# Patient Record
Sex: Male | Born: 1955 | Race: White | Marital: Married | Smoking: Never smoker
Health system: Southern US, Community
[De-identification: ages and names within clinical notes are randomized; demographics above are authoritative.]

## PROBLEM LIST (undated history)

## (undated) DIAGNOSIS — E785 Hyperlipidemia, unspecified: Secondary | ICD-10-CM

## (undated) DIAGNOSIS — K5792 Diverticulitis of intestine, part unspecified, without perforation or abscess without bleeding: Secondary | ICD-10-CM

## (undated) DIAGNOSIS — I10 Essential (primary) hypertension: Secondary | ICD-10-CM

## (undated) DIAGNOSIS — I213 ST elevation (STEMI) myocardial infarction of unspecified site: Secondary | ICD-10-CM

## (undated) HISTORY — DX: ST elevation (STEMI) myocardial infarction of unspecified site: I21.3

## (undated) HISTORY — DX: Hyperlipidemia, unspecified: E78.5

## (undated) HISTORY — PX: APPENDECTOMY: SHX54

---

## 1999-08-08 ENCOUNTER — Encounter: Payer: Self-pay | Admitting: Vascular Surgery

## 1999-08-10 ENCOUNTER — Ambulatory Visit (HOSPITAL_COMMUNITY): Admission: RE | Admit: 1999-08-10 | Discharge: 1999-08-10 | Payer: Self-pay | Admitting: Vascular Surgery

## 2008-05-31 ENCOUNTER — Ambulatory Visit: Payer: Self-pay | Admitting: General Practice

## 2010-03-10 ENCOUNTER — Ambulatory Visit: Payer: Self-pay | Admitting: Gastroenterology

## 2010-07-13 ENCOUNTER — Ambulatory Visit: Payer: Self-pay | Admitting: Internal Medicine

## 2010-08-03 ENCOUNTER — Ambulatory Visit: Payer: Self-pay | Admitting: Internal Medicine

## 2010-09-14 ENCOUNTER — Ambulatory Visit: Payer: Self-pay | Admitting: Internal Medicine

## 2010-10-04 ENCOUNTER — Ambulatory Visit: Payer: Self-pay | Admitting: Internal Medicine

## 2015-03-23 HISTORY — PX: OTHER SURGICAL HISTORY: SHX169

## 2015-08-17 ENCOUNTER — Encounter (HOSPITAL_COMMUNITY)
Admission: EM | Disposition: A | Discharge status: Home / Self Care | Payer: Self-pay | Source: Home / Self Care | Attending: Cardiovascular Disease

## 2015-08-17 ENCOUNTER — Emergency Department: Payer: BLUE CROSS/BLUE SHIELD

## 2015-08-17 ENCOUNTER — Inpatient Hospital Stay (HOSPITAL_COMMUNITY)
Admission: EM | Admit: 2015-08-17 | Discharge: 2015-08-19 | DRG: 247 | Disposition: A | Discharge status: Home / Self Care | Payer: BLUE CROSS/BLUE SHIELD | Attending: Cardiovascular Disease | Admitting: Cardiovascular Disease

## 2015-08-17 ENCOUNTER — Emergency Department
Admission: EM | Admit: 2015-08-17 | Discharge: 2015-08-17 | Disposition: A | Discharge status: Other Acute Inpatient Hospital | Payer: BLUE CROSS/BLUE SHIELD | Attending: Student | Admitting: Student

## 2015-08-17 ENCOUNTER — Encounter: Payer: Self-pay | Admitting: Emergency Medicine

## 2015-08-17 ENCOUNTER — Encounter (HOSPITAL_COMMUNITY): Payer: Self-pay | Admitting: *Deleted

## 2015-08-17 DIAGNOSIS — I213 ST elevation (STEMI) myocardial infarction of unspecified site: Secondary | ICD-10-CM

## 2015-08-17 DIAGNOSIS — R079 Chest pain, unspecified: Secondary | ICD-10-CM

## 2015-08-17 DIAGNOSIS — I2510 Atherosclerotic heart disease of native coronary artery without angina pectoris: Secondary | ICD-10-CM

## 2015-08-17 DIAGNOSIS — Z881 Allergy status to other antibiotic agents status: Secondary | ICD-10-CM

## 2015-08-17 DIAGNOSIS — K5792 Diverticulitis of intestine, part unspecified, without perforation or abscess without bleeding: Secondary | ICD-10-CM | POA: Diagnosis present

## 2015-08-17 DIAGNOSIS — Z888 Allergy status to other drugs, medicaments and biological substances status: Secondary | ICD-10-CM | POA: Diagnosis not present

## 2015-08-17 DIAGNOSIS — I10 Essential (primary) hypertension: Secondary | ICD-10-CM | POA: Insufficient documentation

## 2015-08-17 DIAGNOSIS — I2102 ST elevation (STEMI) myocardial infarction involving left anterior descending coronary artery: Principal | ICD-10-CM

## 2015-08-17 DIAGNOSIS — R739 Hyperglycemia, unspecified: Secondary | ICD-10-CM | POA: Diagnosis not present

## 2015-08-17 DIAGNOSIS — I959 Hypotension, unspecified: Secondary | ICD-10-CM | POA: Diagnosis not present

## 2015-08-17 DIAGNOSIS — Z955 Presence of coronary angioplasty implant and graft: Secondary | ICD-10-CM

## 2015-08-17 DIAGNOSIS — R05 Cough: Secondary | ICD-10-CM | POA: Diagnosis not present

## 2015-08-17 DIAGNOSIS — I252 Old myocardial infarction: Secondary | ICD-10-CM | POA: Insufficient documentation

## 2015-08-17 DIAGNOSIS — E785 Hyperlipidemia, unspecified: Secondary | ICD-10-CM | POA: Diagnosis not present

## 2015-08-17 DIAGNOSIS — Z8249 Family history of ischemic heart disease and other diseases of the circulatory system: Secondary | ICD-10-CM | POA: Diagnosis not present

## 2015-08-17 DIAGNOSIS — T464X5A Adverse effect of angiotensin-converting-enzyme inhibitors, initial encounter: Secondary | ICD-10-CM | POA: Diagnosis not present

## 2015-08-17 HISTORY — DX: Essential (primary) hypertension: I10

## 2015-08-17 HISTORY — PX: CARDIAC CATHETERIZATION: SHX172

## 2015-08-17 HISTORY — DX: ST elevation (STEMI) myocardial infarction of unspecified site: I21.3

## 2015-08-17 HISTORY — DX: Diverticulitis of intestine, part unspecified, without perforation or abscess without bleeding: K57.92

## 2015-08-17 LAB — CBC WITH DIFFERENTIAL/PLATELET
Basophils Absolute: 0.1 10*3/uL (ref 0–0.1)
Basophils Relative: 1 %
Eosinophils Absolute: 0.1 10*3/uL (ref 0–0.7)
Eosinophils Relative: 1 %
HCT: 40.2 % (ref 40.0–52.0)
Hemoglobin: 13.8 g/dL (ref 13.0–18.0)
Lymphocytes Relative: 30 %
Lymphs Abs: 2.4 10*3/uL (ref 1.0–3.6)
MCH: 29.7 pg (ref 26.0–34.0)
MCHC: 34.3 g/dL (ref 32.0–36.0)
MCV: 86.5 fL (ref 80.0–100.0)
Monocytes Absolute: 0.9 10*3/uL (ref 0.2–1.0)
Monocytes Relative: 11 %
Neutro Abs: 4.6 10*3/uL (ref 1.4–6.5)
Neutrophils Relative %: 57 %
Platelets: 185 10*3/uL (ref 150–440)
RBC: 4.64 MIL/uL (ref 4.40–5.90)
RDW: 12.6 % (ref 11.5–14.5)
WBC: 8 10*3/uL (ref 3.8–10.6)

## 2015-08-17 LAB — PROTIME-INR
INR: 1.06
PROTHROMBIN TIME: 14 s (ref 11.4–15.0)

## 2015-08-17 LAB — BASIC METABOLIC PANEL
ANION GAP: 9 (ref 5–15)
BUN: 18 mg/dL (ref 6–20)
CALCIUM: 9.1 mg/dL (ref 8.9–10.3)
CO2: 25 mmol/L (ref 22–32)
Chloride: 103 mmol/L (ref 101–111)
Creatinine, Ser: 1.21 mg/dL (ref 0.61–1.24)
Glucose, Bld: 138 mg/dL — ABNORMAL HIGH (ref 65–99)
Potassium: 3.8 mmol/L (ref 3.5–5.1)
SODIUM: 137 mmol/L (ref 135–145)

## 2015-08-17 LAB — TROPONIN I
Troponin I: <0.03 ng/mL (ref <0.031)
Troponin I: 0.64 ng/mL (ref <0.031)

## 2015-08-17 LAB — MRSA PCR SCREENING: MRSA by PCR: NEGATIVE

## 2015-08-17 LAB — POCT ACTIVATED CLOTTING TIME: Activated Clotting Time: 446 seconds

## 2015-08-17 LAB — APTT: aPTT: 25 seconds (ref 24–36)

## 2015-08-17 SURGERY — LEFT HEART CATH AND CORONARY ANGIOGRAPHY
Laterality: Right

## 2015-08-17 SURGERY — LEFT HEART CATH AND CORONARY ANGIOGRAPHY
Anesthesia: LOCAL

## 2015-08-17 MED ORDER — SODIUM CHLORIDE 0.9 % IV BOLUS (SEPSIS)
1000.0000 mL | Freq: Once | INTRAVENOUS | Status: AC
  Administered 2015-08-17: 1000 mL via INTRAVENOUS

## 2015-08-17 MED ORDER — HEPARIN SODIUM (PORCINE) 5000 UNIT/ML IJ SOLN
4000.0000 [IU] | Freq: Once | INTRAMUSCULAR | Status: AC
  Administered 2015-08-17: 4000 [IU] via INTRAVENOUS
  Filled 2015-08-17: qty 1

## 2015-08-17 MED ORDER — FENTANYL CITRATE (PF) 100 MCG/2ML IJ SOLN
INTRAMUSCULAR | Status: DC | PRN
  Administered 2015-08-17 (×3): 25 ug via INTRAVENOUS

## 2015-08-17 MED ORDER — SODIUM CHLORIDE 0.9 % IV SOLN
INTRAVENOUS | Status: DC | PRN
  Administered 2015-08-17: 150 mL/h via INTRAVENOUS

## 2015-08-17 MED ORDER — IOHEXOL 350 MG/ML SOLN
INTRAVENOUS | Status: DC | PRN
  Administered 2015-08-17: 225 mL via INTRACARDIAC

## 2015-08-17 MED ORDER — ONDANSETRON HCL 4 MG/2ML IJ SOLN
INTRAMUSCULAR | Status: AC
  Filled 2015-08-17: qty 2

## 2015-08-17 MED ORDER — ONDANSETRON HCL 4 MG/2ML IJ SOLN
4.0000 mg | Freq: Once | INTRAMUSCULAR | Status: AC
  Administered 2015-08-17: 4 mg via INTRAVENOUS

## 2015-08-17 MED ORDER — HEPARIN (PORCINE) IN NACL 100-0.45 UNIT/ML-% IJ SOLN
1100.0000 [IU]/h | INTRAMUSCULAR | Status: DC
  Filled 2015-08-17: qty 250

## 2015-08-17 MED ORDER — NITROGLYCERIN 0.4 MG SL SUBL: 0.4000 mg | SUBLINGUAL_TABLET | Freq: Once | SUBLINGUAL | Status: DC

## 2015-08-17 MED ORDER — SODIUM CHLORIDE 0.9 % IV SOLN
250.0000 mg | INTRAVENOUS | Status: DC | PRN
  Administered 2015-08-17: 1.75 mg/kg/h via INTRAVENOUS

## 2015-08-17 MED ORDER — MIDAZOLAM HCL 2 MG/2ML IJ SOLN
INTRAMUSCULAR | Status: AC
  Filled 2015-08-17: qty 2

## 2015-08-17 MED ORDER — LIDOCAINE HCL (PF) 1 % IJ SOLN
INTRAMUSCULAR | Status: DC | PRN
  Administered 2015-08-17: 20 mL via INTRADERMAL

## 2015-08-17 MED ORDER — MORPHINE SULFATE (PF) 4 MG/ML IV SOLN
INTRAVENOUS | Status: AC
  Filled 2015-08-17: qty 1

## 2015-08-17 MED ORDER — CARVEDILOL 3.125 MG PO TABS
3.1250 mg | ORAL_TABLET | Freq: Two times a day (BID) | ORAL | Status: DC
  Administered 2015-08-17 – 2015-08-19 (×4): 3.125 mg via ORAL
  Filled 2015-08-17 (×4): qty 1

## 2015-08-17 MED ORDER — FENTANYL CITRATE (PF) 100 MCG/2ML IJ SOLN
50.0000 ug | Freq: Once | INTRAMUSCULAR | Status: AC
  Administered 2015-08-17: 50 ug via INTRAVENOUS

## 2015-08-17 MED ORDER — SODIUM CHLORIDE 0.9 % IV SOLN
INTRAVENOUS | Status: AC
  Administered 2015-08-17: 150 mL/h via INTRAVENOUS
  Administered 2015-08-17: 19:00:00 via INTRAVENOUS

## 2015-08-17 MED ORDER — TICAGRELOR 90 MG PO TABS
90.0000 mg | ORAL_TABLET | Freq: Two times a day (BID) | ORAL | Status: DC
  Administered 2015-08-17 – 2015-08-19 (×4): 90 mg via ORAL
  Filled 2015-08-17 (×5): qty 1

## 2015-08-17 MED ORDER — SODIUM CHLORIDE 0.9 % IV SOLN: 250.0000 mL | INTRAVENOUS | Status: DC | PRN

## 2015-08-17 MED ORDER — NITROGLYCERIN 1 MG/10 ML FOR IR/CATH LAB
INTRA_ARTERIAL | Status: DC | PRN
  Administered 2015-08-17 (×2): 200 ug via INTRACORONARY

## 2015-08-17 MED ORDER — FENTANYL CITRATE (PF) 100 MCG/2ML IJ SOLN
INTRAMUSCULAR | Status: AC
  Filled 2015-08-17: qty 2

## 2015-08-17 MED ORDER — SODIUM CHLORIDE 0.9 % IJ SOLN
3.0000 mL | Freq: Two times a day (BID) | INTRAMUSCULAR | Status: DC
  Administered 2015-08-17 – 2015-08-19 (×4): 3 mL via INTRAVENOUS

## 2015-08-17 MED ORDER — BIVALIRUDIN 250 MG IV SOLR
INTRAVENOUS | Status: AC
  Filled 2015-08-17: qty 250

## 2015-08-17 MED ORDER — MIDAZOLAM HCL 2 MG/2ML IJ SOLN
INTRAMUSCULAR | Status: DC | PRN
  Administered 2015-08-17 (×2): 1 mg via INTRAVENOUS
  Administered 2015-08-17: 2 mg via INTRAVENOUS

## 2015-08-17 MED ORDER — SODIUM CHLORIDE 0.9 % IV SOLN
INTRAVENOUS | Status: DC
  Administered 2015-08-17: 20 mL/h via INTRAVENOUS

## 2015-08-17 MED ORDER — CLOPIDOGREL BISULFATE 75 MG PO TABS
600.0000 mg | ORAL_TABLET | Freq: Once | ORAL | Status: AC
  Administered 2015-08-17: 600 mg via ORAL
  Filled 2015-08-17: qty 8

## 2015-08-17 MED ORDER — ONDANSETRON HCL 4 MG/2ML IJ SOLN: 4.0000 mg | Freq: Four times a day (QID) | INTRAMUSCULAR | Status: DC | PRN

## 2015-08-17 MED ORDER — NITROGLYCERIN 1 MG/10 ML FOR IR/CATH LAB
INTRA_ARTERIAL | Status: AC
  Filled 2015-08-17: qty 10

## 2015-08-17 MED ORDER — MORPHINE SULFATE (PF) 4 MG/ML IV SOLN
4.0000 mg | Freq: Once | INTRAVENOUS | Status: AC
  Administered 2015-08-17: 4 mg via INTRAVENOUS

## 2015-08-17 MED ORDER — LIDOCAINE HCL (PF) 1 % IJ SOLN
INTRAMUSCULAR | Status: AC
  Filled 2015-08-17: qty 30

## 2015-08-17 MED ORDER — HEPARIN (PORCINE) IN NACL 2-0.9 UNIT/ML-% IJ SOLN
INTRAMUSCULAR | Status: AC
  Filled 2015-08-17: qty 1000

## 2015-08-17 MED ORDER — SODIUM CHLORIDE 0.9 % IJ SOLN: 3.0000 mL | INTRAMUSCULAR | Status: DC | PRN

## 2015-08-17 MED ORDER — ATROPINE SULFATE 0.1 MG/ML IJ SOLN
INTRAMUSCULAR | Status: AC
  Filled 2015-08-17: qty 10

## 2015-08-17 MED ORDER — BIVALIRUDIN BOLUS VIA INFUSION - CUPID
INTRAVENOUS | Status: DC | PRN
  Administered 2015-08-17: 65.325 mg via INTRAVENOUS

## 2015-08-17 MED ORDER — CETYLPYRIDINIUM CHLORIDE 0.05 % MT LIQD: 7.0000 mL | Freq: Two times a day (BID) | OROMUCOSAL | Status: DC

## 2015-08-17 MED ORDER — ACETAMINOPHEN 325 MG PO TABS: 650.0000 mg | ORAL_TABLET | ORAL | Status: DC | PRN

## 2015-08-17 MED ORDER — ASPIRIN EC 81 MG PO TBEC
81.0000 mg | DELAYED_RELEASE_TABLET | Freq: Every day | ORAL | Status: DC
  Administered 2015-08-18 – 2015-08-19 (×2): 81 mg via ORAL
  Filled 2015-08-17 (×3): qty 1

## 2015-08-17 SURGICAL SUPPLY — 18 items
BALLN EUPHORA RX 2.0X12 (BALLOONS) ×4
BALLN ~~LOC~~ EUPHORA RX 3.75X15 (BALLOONS) ×4
BALLOON EUPHORA RX 2.0X12 (BALLOONS) ×2 IMPLANT
BALLOON ~~LOC~~ EUPHORA RX 3.75X15 (BALLOONS) ×2 IMPLANT
CATH INFINITI 5FR MULTPACK ANG (CATHETERS) ×4 IMPLANT
CATH VISTA GUIDE 6FR XB4 (CATHETERS) IMPLANT
CATH VISTA GUIDE 6FR XBLAD4 (CATHETERS) ×4 IMPLANT
KIT ENCORE 26 ADVANTAGE (KITS) ×4 IMPLANT
KIT HEART LEFT (KITS) ×4 IMPLANT
PACK CARDIAC CATHETERIZATION (CUSTOM PROCEDURE TRAY) ×4 IMPLANT
SHEATH PINNACLE 6F 10CM (SHEATH) ×4 IMPLANT
STENT RESOLUTE INTEG 3.5X22 (Permanent Stent) ×4 IMPLANT
SYR MEDRAD MARK V 150ML (SYRINGE) ×4 IMPLANT
TRANSDUCER W/STOPCOCK (MISCELLANEOUS) ×4 IMPLANT
TUBING CIL FLEX 10 FLL-RA (TUBING) ×4 IMPLANT
WIRE ASAHI PROWATER 180CM (WIRE) ×4 IMPLANT
WIRE EMERALD 3MM-J .035X150CM (WIRE) ×4 IMPLANT
WIRE SAFE-T 1.5MM-J .035X260CM (WIRE) ×4 IMPLANT

## 2015-08-17 NOTE — Progress Notes (Signed)
CRITICAL VALUE ALERT  Critical value received: Troponin =0.64   Date of notification: 08/17/15  Time of notification:  1630  Critical value read back:Yes  Nurse who received alert:  O. Delford FieldAnde, RN  MD notified (1st page):  No. Post cath troponin, expected to be high  Time of first page:   MD notified (2nd page):  Time of second page:  Responding MD:    Time MD responded:

## 2015-08-17 NOTE — H&P (Signed)
Patient ID: George Jimenez MRN: [id number], DOB/AGE: 1956-07-28   Admit date: 08/17/2015   Primary Physician: Danella Penton., George Jimenez Primary Cardiologist: New- Dr. Tresa Endo  Pt. Profile:  George Jimenez is a 59 y.o. male with a history of HTN and diverticulitis who was transferred to [facility] from [facility] for with an acute STEMI.    He presented to [facility] emergency department today for evaluation of sudden onset central nonradiating chest pain/pressure today which began while he was doing back exercises at the gym. Pain was constant since onset, associated with diaphoresis and shortness of breath. He also had nausea which proceeded a fainting episode at the gym per notes from ED provider at [facility]. However, patient denies passing out.   He works out everyday and prior to this week has never had any exertional symptoms. This past week he has had on and off chest pain associated with exertion but nothing as severe as today. First troponin negative and all other labwork unremarkable, except some mild hyperglycemia.   His first ECG showed no acute ST elevation. With 1 mm of ST depression in V3, V4. A subsequent ECG showed  ST elevation in lead I, aVL, V2. ST depression in 2, 3, aVF as well as hyperacute T waves in V3-V6 and he was transferred to [facility] for emergent cardiac catheterization. He was given 1 SL NTG with no relief. He was given morphine and fentanyl which caused marked hypotension. He is now chest pain free.   He is a never smoker. He only take Losartan for HTN and was recently placed on an antibiotic for diverticulitis but he cannot recall the name. He denies use of pre workout supplements. No hx of CVA or major bleed. His father had CABG in his 28s. No other heart disease in his family.    Problem List  Past Medical History  Diagnosis Date  . HTN (hypertension)   . Diverticulitis     No past surgical history on file.   Allergies  Allergies  Allergen Reactions  . Levaquin [Levofloxacin In  D5w]      Home Medications  Prior to Admission medications   Not on File    Family History  Family History  Problem Relation Age of Onset  . Heart attack Father     CABG in 16s   No family status information on file.     Social History  Social History   Social History  . Marital Status: Married    Spouse Name: N/A  . Number of Children: N/A  . Years of Education: N/A   Occupational History  . Not on file.   Social History Main Topics  . Smoking status: Never Smoker   . Smokeless tobacco: Not on file  . Alcohol Use: No  . Drug Use: Not on file  . Sexual Activity: Not on file   Other Topics Concern  . Not on file   Social History Narrative  . No narrative on file      All other systems reviewed and are otherwise negative except as noted above.  Physical Exam  Blood pressure 115/73, pulse 0, resp. rate 0, SpO2 0 %.  General: Pleasant, NAD. Fit appearing white male. Tremulous Psych: Normal affect. Neuro: Alert and oriented X 3. Moves all extremities spontaneously. HEENT: Normal  Neck: Supple without bruits or JVD. Lungs:  Resp regular and unlabored, CTA. Heart: RRR no s3, s4, or murmurs. Abdomen: Soft, non-tender, non-distended, BS + x 4.  Extremities: No clubbing,  cyanosis or edema. DP/PT/Radials 2+ and equal bilaterally.  Labs   Recent Labs  08/17/15 1312  TROPONINI <0.03   Lab Results  Component Value Date   WBC 8.0 08/17/2015   HGB 13.8 08/17/2015   HCT 40.2 08/17/2015   MCV 86.5 08/17/2015   PLT 185 08/17/2015    Recent Labs Lab 08/17/15 1312  NA 137  K 3.8  CL 103  CO2 25  BUN 18  CREATININE 1.21  CALCIUM 9.1  GLUCOSE 138*   No results found for: CHOL, HDL, LDLCALC, TRIG No results found for: DDIMER   Radiology/Studies  Dg Chest Portable 1 View  08/17/2015  CLINICAL DATA:  Syncopal episode while working out. Chest pain for 1 week. EXAM: PORTABLE CHEST 1 VIEW COMPARISON:  None. FINDINGS: Normal cardiac silhouette and  mediastinal contours given decreased lung volumes and AP projection. Minimal bilateral infrahilar opacities favored to represent atelectasis. No pleural effusion or pneumothorax. No evidence of edema. No acute osseus abnormalities. IMPRESSION: No definite acute cardiopulmonary disease on this AP portable examination. Further evaluation with a PA and lateral chest radiograph may be obtained as clinically indicated. Electronically Signed   By: Simonne ComeJohn  Watts M.D.   On: 08/17/2015 13:54    ECG: 08/17/2015  Rate: 49  Rhythm: sinus bradycardia  Axis: normal  Intervals:none  ST&T Change: ST elevation in lead 1, aVL, V2. ST depression in 2, 3, aVF. Hyperacute T waves in V3-V6.   ASSESSMENT AND PLAN  George Jimenez is a 59 y.o. male with a history of HTN and diverticulitis who was [facility] for with an acute STEMI.    PLAN: emergent cardiac catheterization with possible PCI.    Billy FischerSigned, THOMPSON, KATHRYN R, PA-C 08/17/2015, 4:02 PM  Pager [phone]   Patient seen and examined. Agree with assessment and plan.  George Jimenez is a 59 year old male without any known history of prior coronary artery disease.  He has a history of hypertension which has been treated with losartan.  He recently was placed on an antibiotic for diverticulitis.  The patient exercises regularly.  He denies any use of supplements.  Over the past week, he has noticed some mild chest sensation.  Today while he was doing back exercises he developed  substernal chest pain which was constant, associated with diaphoresis and shortness of breath.  He presented to [facility].  Reportedly an initial ECG was without acute ST segment changes, but a subsequent ECG showed ST elevation in leads 1, aVL, significant ST depression inferiorly, and hyperacute T waves V3 through V6.  A code STEMI was activated and he was transferred emergently to  [facility] for emergent cardiac catheterization and probable  PCI if indicated.  Note, prior to transfer [facility] he received Plavix 600 mg orally and heparin.  George Biharihomas A. Rannie Craney, George Jimenez, [facility] 08/17/2015 4:12 PM

## 2015-08-17 NOTE — Progress Notes (Signed)
(  Consult ordered when [facility])  ANTICOAGULATION CONSULT NOTE - Initial Consult  Pharmacy Consult for Heparin Drip Indication: chest pain/ACS  Allergies  Allergen Reactions  . Ace Inhibitors Cough  . Levaquin [Levofloxacin In D5w]     Patient Measurements: Height: 6' (182.9 cm) Weight: 192 lb (87.091 kg) IBW/kg (Calculated) : 77.6 Heparin Dosing Weight: 87 kg  Vital Signs: Temp: 97.5 F (36.4 C) (12/14 1400) BP: 115/73 mmHg (12/14 1534) Pulse Rate: 0 (12/14 1554)  Labs:  Recent Labs  08/17/15 1312  HGB 13.8  HCT 40.2  PLT 185  APTT 25  LABPROT 14.0  INR 1.06  CREATININE 1.21  TROPONINI <0.03    Estimated Creatinine Clearance: 73 mL/min (by C-G formula based on Cr of 1.21).   Medical History: Past Medical History  Diagnosis Date  . HTN (hypertension)   . Diverticulitis     Medications:  Scheduled:  Infusions:   Assessment: 59 yo male to start Heparin drip for ACS/STEMI. No anticoagulants noted in PTA medications at this time.  Goal of Therapy:  Heparin level 0.3-0.7 units/ml Monitor platelets per protocol.   Plan:  Give 4000 units bolus x 1  Start Heparin drip at 1100 units/hour. Order Heparin level in 6 hours.  Addendum:   Patient [facility].   Bari MantisKristin Tirsa Gail PharmD Clinical Pharmacist 08/17/2015

## 2015-08-17 NOTE — ED Provider Notes (Signed)
[facility] Emergency Department Provider Note  ____________________________________________  Time seen: Approximately 1:15 PM  I have reviewed the triage vital signs and the nursing notes.   HISTORY  Chief Complaint Chest Pain    HPI Eean Buss is a 59 y.o. male hypertension and diverticulitis who presents for evaluation of sudden onset central nonradiating chest pain/pressure today which began while he was doing back exercises at the gym. Pain has been constant since onset, associated with diaphoresis and shortness of breath. He also had nausea which proceeded a fainting episode at the gym. On EMS arrival, he was alert but hypotensive. The ports pain does not radiate to his back, into his neck or shoulders or down towards his feet. He has been having some mild chest pain intermittently over the past several days but none so severe as this.  History reviewed. No pertinent past medical history.  There are no active problems to display for this patient.   History reviewed. No pertinent past surgical history.  No current outpatient prescriptions on file.  Allergies Review of patient's allergies indicates no known allergies.  History reviewed. No pertinent family history.  Social History Social History  Substance Use Topics  . Smoking status: Never Smoker   . Smokeless tobacco: None  . Alcohol Use: No    Review of Systems Constitutional: No fever/chills Eyes: No visual changes. ENT: No sore throat. Cardiovascular: + chest pain. Respiratory: Denies shortness of breath. Gastrointestinal: No abdominal pain.  + nausea, no vomiting.  No diarrhea.  No constipation. Genitourinary: Negative for dysuria. Musculoskeletal: Negative for back pain. Skin: Negative for rash. Neurological: Negative for headaches, focal weakness or numbness.  10-point ROS otherwise negative.  ____________________________________________   PHYSICAL EXAM:   Filed Vitals:    08/17/15 1316 08/17/15 1358  BP: 118/85 119/84  Pulse: 61 58  Temp: 97.7 F (36.5 C) 97.5 F (36.4 C)  Resp: 10 16  Height: 6' (1.829 m)   Weight: 192 lb (87.091 kg)      Constitutional: Alert and oriented. In distress due to pain. Eyes: Conjunctivae are normal. PERRL. EOMI. Head: Atraumatic. Nose: No congestion/rhinnorhea. Mouth/Throat: Mucous membranes are moist.  Oropharynx non-erythematous. Neck: No stridor. Cardiovascular: Normal rate, regular rhythm. Grossly normal heart sounds.  Good peripheral circulation. Respiratory: Normal respiratory effort.  No retractions. Lungs CTAB. Gastrointestinal: Soft and nontender. No distention.  No CVA tenderness. Genitourinary: deferred Musculoskeletal: No lower extremity tenderness nor edema.  No joint effusions. Neurologic:  Normal speech and language. No gross focal neurologic deficits are appreciated. No gait instability. Skin:  Skin is warm, dry and intact. No rash noted. Psychiatric: Mood and affect are normal. Speech and behavior are normal.  ____________________________________________   LABS (all labs ordered are listed, but only abnormal results are displayed)  Labs Reviewed  BASIC METABOLIC PANEL - Abnormal; Notable for the following:    Glucose, Bld 138 (*)    All other components within normal limits  CBC WITH DIFFERENTIAL/PLATELET  TROPONIN I  PROTIME-INR  APTT   ____________________________________________  EKG  ED ECG REPORT I, Gayla Doss, the attending physician, personally viewed and interpreted this ECG.   Date: 08/17/2015  EKG Time: 13:06  Rate: 67  Rhythm: normal sinus rhythm  Axis: normal  Intervals:none  ST&T Change: No acute ST elevation. With 1 mm of ST depression in V3, V4.  ED ECG REPORT I, Gayla Doss, the attending physician, personally viewed and interpreted this ECG.   Date: 08/17/2015  EKG Time: 13:32  Rate: 49  Rhythm: sinus bradycardia  Axis: normal  Intervals:none   ST&T Change: ST elevation in lead 1, aVL, V2. ST depression in 2, 3, aVF. Hyperacute T waves in V4, V5, V6.   ____________________________________________  RADIOLOGY  CXR IMPRESSION: No definite acute cardiopulmonary disease on this AP portable examination. Further evaluation with a PA and lateral chest radiograph may be obtained as clinically indicated.   ____________________________________________   PROCEDURES  Procedure(s) performed: None  Critical Care performed: Yes, see critical care note(s). Total critical care time spent 35 minutes.  ____________________________________________   INITIAL IMPRESSION / ASSESSMENT AND PLAN / ED COURSE  Pertinent labs & imaging results that were available during my care of the patient were reviewed by me and considered in my medical decision making (see chart for details).  Lita MainsRussell Hark is a 59 y.o. male hypertension and diverticulitis who presents for evaluation of sudden onset central nonradiating chest pain/pressure today which began while he was doing back exercises at the gym. He also had an episode of syncope today that followed the onset of chest pain. On arrival to the emergency department, he is in distress due to pain. He is afebrile, no tachycardia. BP transiently dropped to 80s/50s, improved with IV fluids. EKG not consistent with STEMI. We'll obtain screening cardiac labs, chest x-ray, treat his pain. No nitroglycerin at this time secondary to intermittent drops in blood pressure.  ----------------------------------------- 1:44 PM on 08/17/2015 -----------------------------------------  Repeat EKG obtained given persistent pain. 1 mm ST elevation noted in lead 1, aVL, hyperacute T waves in V4, V5, V6 with reciprocal ST depression in inferior leads. Case [facility] the patient as transfer given concern for STEMI. There is [facility].  Heparin, Plavix ordered. Patient received aspirin from EMS and routes. Due to intermittent hypotension, no nitroglycerin but pain improved with fentanyl and morphine at this time (currently rates pain as 2/10). ____________________________________________   FINAL CLINICAL IMPRESSION(S) / ED DIAGNOSES  Final diagnoses:  ST elevation myocardial infarction (STEMI), unspecified artery (HCC)  Chest pain, unspecified chest pain type      Gayla DossEryka A Dagny Fiorentino, MD 08/17/15 1400

## 2015-08-17 NOTE — Progress Notes (Signed)
Right Femoral Sheath, site level 0 and distal pulses present Patient educated prior to sheath removal and patient verbalized understanding in his own words. Pressure held for 20 minutes starting at 2028 and ending at 2048. Patient vital signs stable and patient had no complaints during sheath removal. Post sheath removal site level 0 and distal pulses equal to baseline. Post sheath removal instructions given to patient.  Patient verbalized and demonstrated understanding. Pressure dressing applied.  Ivery QualeJackson, Miquel Stacks A, RN

## 2015-08-17 NOTE — H&P (Deleted)
Patient ID: Jasraj Lappe MRN: [id number], DOB/AGE: July 14, 1956   Admit date: 08/17/2015   Primary Physician: Danella Penton., MD Primary Cardiologist: New- Dr. Tresa Endo Pt. Profile:  Gaege Sangalang is a 59 y.o. male with a history of HTN and diverticulitis who was transferred to [facility] from [facility] for with an acute STEMI.    He presented to [facility] emergency department today for evaluation of sudden onset central nonradiating chest pain/pressure today which began while he was doing back exercises at the gym. Pain was constant since onset, associated with diaphoresis and shortness of breath. He also had nausea which proceeded a fainting episode at the gym per notes from ED provider at [facility]. However, patient denies passing out.   He works out everyday and prior to this week has never had any exertional symptoms. This past week he has had on and off chest pain associated with exertion but nothing as severe as today. First troponin negative and all other labwork unremarkable, except some mild hyperglycemia.   His first ECG showed no acute ST elevation. With 1 mm of ST depression in V3, V4. A subsequent ECG showed  ST elevation in lead I, aVL, V2. ST depression in 2, 3, aVF as well as hyperacute T waves in V3-V6 and he was transferred to [facility] for emergent cardiac catheterization. He was given 1 SL NTG with no relief. He was given morphine and fentanyl which caused marked hypotension. He is now chest pain free.   He is a never smoker. He only take Losartan for HTN and was recently placed on an antibiotic for diverticulitis but he cannot recall the name. He denies use of pre workout supplements. No hx of CVA or major bleed. His father had CABG in his 44s. No other heart disease in his family.    Problem List  Past Medical History  Diagnosis Date  . HTN (hypertension)   . Diverticulitis     History reviewed. No pertinent past surgical history.   Allergies  Allergies  Allergen Reactions  . Levaquin  [Levofloxacin In D5w]      Home Medications  Prior to Admission medications   Not on File    Family History  Family History  Problem Relation Age of Onset  . Heart attack Father     CABG in 31s   No family status information on file.     Social History  Social History   Social History  . Marital Status: Married    Spouse Name: N/A  . Number of Children: N/A  . Years of Education: N/A   Occupational History  . Not on file.   Social History Main Topics  . Smoking status: Never Smoker   . Smokeless tobacco: Not on file  . Alcohol Use: No  . Drug Use: Not on file  . Sexual Activity: Not on file   Other Topics Concern  . Not on file   Social History Narrative  . No narrative on file      All other systems reviewed and are otherwise negative except as noted above.  Physical Exam  Blood pressure 119/84, pulse 58, temperature 97.5 F (36.4 C), resp. rate 16, height 6' (1.829 m), weight 192 lb (87.091 kg), SpO2 100 %.  General: Pleasant, NAD. healthing appearing while male. Shaking  Psych: Normal affect. Neuro: Alert and oriented X 3. Moves all extremities spontaneously. HEENT: Normal  Neck: Supple without bruits or JVD. Lungs:  Resp regular and unlabored, CTA. Heart: RRR no s3,  s4, or murmurs. Abdomen: Soft, non-tender, non-distended, BS + x 4.  Extremities: No clubbing, cyanosis or edema. DP/PT/Radials 2+ and equal bilaterally.  Labs   Recent Labs  08/17/15 1312  TROPONINI <0.03   Lab Results  Component Value Date   WBC 8.0 08/17/2015   HGB 13.8 08/17/2015   HCT 40.2 08/17/2015   MCV 86.5 08/17/2015   PLT 185 08/17/2015     Recent Labs Lab 08/17/15 1312  NA 137  K 3.8  CL 103  CO2 25  BUN 18  CREATININE 1.21  CALCIUM 9.1  GLUCOSE 138*   No results found for: CHOL, HDL, LDLCALC, TRIG No results found for: DDIMER   Radiology/Studies  Dg Chest Portable 1 View  08/17/2015  CLINICAL DATA:  Syncopal episode while working out.  Chest pain for 1 week. EXAM: PORTABLE CHEST 1 VIEW COMPARISON:  None. FINDINGS: Normal cardiac silhouette and mediastinal contours given decreased lung volumes and AP projection. Minimal bilateral infrahilar opacities favored to represent atelectasis. No pleural effusion or pneumothorax. No evidence of edema. No acute osseus abnormalities. IMPRESSION: No definite acute cardiopulmonary disease on this AP portable examination. Further evaluation with a PA and lateral chest radiograph may be obtained as clinically indicated. Electronically Signed   By: Simonne ComeJohn  Watts M.D.   On: 08/17/2015 13:54    ECG: 08/17/2015 Rate: 49 Rhythm: sinus bradycardia Axis: normal Intervals:none ST&T Change: ST elevation in lead 1, aVL, V2. ST depression in 2, 3, aVF. Hyperacute T waves in V3-V6.   ASSESSMENT AND PLAN  Lita MainsRussell Garrod is a 59 y.o. male with a history of HTN and diverticulitis who [facility] for with an acute STEMI.    PLAN: emergent cardiac catheterization with possible PCI.     Billy FischerSigned, Braniya Farrugia R, PA-C 08/17/2015, 2:39 PM  Pager [phone]

## 2015-08-17 NOTE — ED Notes (Signed)
Pt via ems from gym; syncopal episode while working out. Pt has had cp x 1 week.

## 2015-08-18 ENCOUNTER — Encounter (HOSPITAL_COMMUNITY): Payer: Self-pay | Admitting: Cardiovascular Disease

## 2015-08-18 ENCOUNTER — Inpatient Hospital Stay (HOSPITAL_COMMUNITY): Payer: BLUE CROSS/BLUE SHIELD

## 2015-08-18 DIAGNOSIS — I10 Essential (primary) hypertension: Secondary | ICD-10-CM

## 2015-08-18 DIAGNOSIS — T464X5A Adverse effect of angiotensin-converting-enzyme inhibitors, initial encounter: Secondary | ICD-10-CM

## 2015-08-18 DIAGNOSIS — R05 Cough: Secondary | ICD-10-CM

## 2015-08-18 DIAGNOSIS — I213 ST elevation (STEMI) myocardial infarction of unspecified site: Secondary | ICD-10-CM

## 2015-08-18 LAB — BASIC METABOLIC PANEL
ANION GAP: 9 (ref 5–15)
BUN: 12 mg/dL (ref 6–20)
CALCIUM: 8.3 mg/dL — AB (ref 8.9–10.3)
CO2: 20 mmol/L — AB (ref 22–32)
CREATININE: 1.02 mg/dL (ref 0.61–1.24)
Chloride: 108 mmol/L (ref 101–111)
Glucose, Bld: 169 mg/dL — ABNORMAL HIGH (ref 65–99)
Potassium: 3.7 mmol/L (ref 3.5–5.1)
SODIUM: 137 mmol/L (ref 135–145)

## 2015-08-18 LAB — CBC
HCT: 34.5 % — ABNORMAL LOW (ref 39.0–52.0)
HEMOGLOBIN: 11.8 g/dL — AB (ref 13.0–17.0)
MCH: 29.6 pg (ref 26.0–34.0)
MCHC: 34.2 g/dL (ref 30.0–36.0)
MCV: 86.7 fL (ref 78.0–100.0)
PLATELETS: 100 10*3/uL — AB (ref 150–400)
RBC: 3.98 MIL/uL — AB (ref 4.22–5.81)
RDW: 12.4 % (ref 11.5–15.5)
WBC: 5.7 10*3/uL (ref 4.0–10.5)

## 2015-08-18 LAB — LIPID PANEL
CHOL/HDL RATIO: 5.1 ratio
CHOLESTEROL: 149 mg/dL (ref 0–200)
HDL: 29 mg/dL — ABNORMAL LOW (ref >40)
LDL Cholesterol: 94 mg/dL (ref 0–99)
TRIGLYCERIDES: 130 mg/dL (ref <150)
VLDL: 26 mg/dL (ref 0–40)

## 2015-08-18 MED ORDER — ATORVASTATIN CALCIUM 80 MG PO TABS
80.0000 mg | ORAL_TABLET | Freq: Every day | ORAL | Status: DC
  Administered 2015-08-18: 80 mg via ORAL
  Filled 2015-08-18: qty 1

## 2015-08-18 MED ORDER — CEFUROXIME AXETIL 500 MG PO TABS
500.0000 mg | ORAL_TABLET | Freq: Two times a day (BID) | ORAL | Status: DC
  Administered 2015-08-18 – 2015-08-19 (×3): 500 mg via ORAL
  Filled 2015-08-18 (×4): qty 1

## 2015-08-18 MED ORDER — LOSARTAN POTASSIUM 25 MG PO TABS
25.0000 mg | ORAL_TABLET | Freq: Every day | ORAL | Status: DC
  Administered 2015-08-18 – 2015-08-19 (×2): 25 mg via ORAL
  Filled 2015-08-18 (×2): qty 1

## 2015-08-18 MED FILL — Heparin Sodium (Porcine) 2 Unit/ML in Sodium Chloride 0.9%: INTRAMUSCULAR | Qty: 1000 | Status: AC

## 2015-08-18 NOTE — Care Management Note (Signed)
Case Management Note  Patient Details  Name: Lita MainsRussell Romer MRN: [id number] Date of Birth: 08/26/1956  Subjective/Objective:        Adm w mi            Action/Plan: lives w fam, pcp dr Hyacinth Meekermiller   Expected Discharge Date:                  Expected Discharge Plan:  Home/Self Care  In-House Referral:     Discharge planning Services  CM Consult, Medication Assistance  Post Acute Care Choice:    Choice offered to:     DME Arranged:    DME Agency:     HH Arranged:    HH Agency:     Status of Service:     Medicare Important Message Given:    Date Medicare IM Given:    Medicare IM give by:    Date Additional Medicare IM Given:    Additional Medicare Important Message give by:     If discussed at Long Length of Stay Meetings, dates discussed:    Additional Comments: ur review done. Gave pt 30day free and copay card for brilinta. Has bcbs ins.  Hanley Haysowell, Mackinze Criado T, RN 08/18/2015, 10:10 AM

## 2015-08-18 NOTE — Progress Notes (Signed)
CARDIAC REHAB PHASE I   PRE:  Rate/Rhythm: 70 SR  BP:  Sitting: 129/83        SaO2: 99 RA  MODE:  Ambulation: 700 ft   POST:  Rate/Rhythm: 86 SR  BP:  Sitting: 136/75         SaO2: 98 RA  Pt ambulated 700 ft on RA, independent, steady gait, tolerated well.  Pt denies cp, dizziness, DOE, declined rest stop. Completed MI/stent education with pt and wife at bedside.  Reviewed risk factors, MI book, anti-platelet therapy, stent card, activity restrictions, ntg, exercise, heart healthy diet, and phase 2 cardiac rehab. Pt verbalized understanding. Pt agrees to phase 2 cardiac rehab referral, [facility]. Pt to recliner after walk, call bell within reach. Will follow.  [phone] George Jimenez, George Trawick C, RN, BSN 08/18/2015 9:52 AM

## 2015-08-18 NOTE — Progress Notes (Signed)
  Echocardiogram 2D Echocardiogram has been performed.  Janalyn HarderWest, Legacy Carrender R 08/18/2015, 12:19 PM

## 2015-08-18 NOTE — Progress Notes (Signed)
Subjective:  Day 1 s/p STEMI; no cp or dyspnea  Objective:   Vital Signs : Filed Vitals:   08/18/15 0500 08/18/15 0600 08/18/15 0700 08/18/15 0802  BP: 127/81 123/80 120/86   Pulse: 64 62 63   Temp:    97.8 F (36.6 C)  TempSrc:    Oral  Resp: Height:      Weight:      SpO2: 99% 98% 96%     Intake/Output from previous day: +748  Intake/Output Summary (Last 24 hours) at 08/18/15 0802 Last data filed at 08/18/15 0700  Gross per 24 hour  Intake 2048.48 ml  Output   1300 ml  Net 748.48 ml    I/O since admission:  Wt Readings from Last 3 Encounters:  08/17/15 194 lb 0.1 oz (88 kg)  08/17/15 192 lb (87.091 kg)    Medications: . aspirin EC  81 mg Oral Daily  . carvedilol  3.125 mg Oral BID WC  . sodium chloride  3 mL Intravenous Q12H  . ticagrelor  90 mg Oral BID        Physical Exam:   General appearance: well developed, muscular, alert, cooperative and no distress Neck: no adenopathy, no carotid bruit, no JVD, supple, symmetrical, trachea midline and thyroid not enlarged, symmetric, no tenderness/mass/nodules Lungs: clear to auscultation bilaterally Heart: regular rate and rhythm and no rub Abdomen: soft, non-tender; bowel sounds normal; no masses,  no organomegaly Extremities: no edema, redness or tenderness in the calves or thighs Pulses: 2+ and symmetric Skin: Skin color, texture, turgor normal. No rashes or lesions Neurologic: Grossly normal   Rate: 69  Rhythm: normal sinus rhythm  08/18/15 ECG (independently read by me): NSR at 67; normal  08/17/15 ECG post PCI (independently read by me): NSR at 75; normal  08/17/15 ECG pre procedure (independently read by me): SB at 49 ST depression 2,3, aVF, V5-6 with hyperacute T waves I, aVLV2-4  Lab Results:   Recent Labs  08/17/15 1312 08/18/15 0425  NA 137 137  K 3.8 3.7  CL 103 108  CO2 25 20*  GLUCOSE 138* 169*  BUN 18 12  CREATININE 1.21 1.02  CALCIUM 9.1 8.3*    No flowsheet  data found.   Recent Labs  08/17/15 1312 08/18/15 0425  WBC 8.0 5.7  NEUTROABS 4.6  --   HGB 13.8 11.8*  HCT 40.2 34.5*  MCV 86.5 86.7  PLT 185 100*     Recent Labs  08/17/15 1312 08/17/15 1632  TROPONINI <0.03 0.64*    No results found for: TSH No results for input(s): HGBA1C in the last 72 hours.  No results for input(s): PROT, ALBUMIN, AST, ALT, ALKPHOS, BILITOT, BILIDIR, IBILI in the last 72 hours.  Recent Labs  08/17/15 1312  INR 1.06   BNP (last 3 results) No results for input(s): BNP in the last 8760 hours.  ProBNP (last 3 results) No results for input(s): PROBNP in the last 8760 hours.   Lipid Panel  No results found for: CHOL, TRIG, HDL, CHOLHDL, VLDL, LDLCALC, LDLDIRECT   Imaging:  Dg Chest Portable 1 View  08/17/2015  CLINICAL DATA:  Syncopal episode while working out. Chest pain for 1 week. EXAM: PORTABLE CHEST 1 VIEW COMPARISON:  None. FINDINGS: Normal cardiac silhouette and mediastinal contours given decreased lung volumes and AP projection. Minimal bilateral infrahilar opacities favored to represent atelectasis. No pleural effusion or pneumothorax. No evidence of edema. No acute osseus abnormalities. IMPRESSION: No  definite acute cardiopulmonary disease on this AP portable examination. Further evaluation with a PA and lateral chest radiograph may be obtained as clinically indicated. Electronically Signed   By: Simonne ComeJohn  Watts M.D.   On: 08/17/2015 13:54   Emergent Cath 08/17/15: Conclusion     Ost LAD to Prox LAD lesion, 20% stenosed.  Mid RCA lesion, 65% stenosed.  Mid LAD-2 lesion, 40% stenosed.  Mid LAD-1 lesion, 99% stenosed. Post intervention, there is a 0% residual stenosis.  There is mild left ventricular systolic dysfunction.  Mild acute LV dysfunction with an ejection fraction of 45-50% and mild mid anterolateral hypocontractility.  Two-vessel coronary obstructive disease with 20% smooth ostial narrowing of the LAD and 95-99%  proximal LAD stenosis in the region of the proximal septal perforating artery followed by ectatic segment and then 40% narrowing prior to the takeoff of the first diagonal branch of the LAD; normal left circumflex coronary artery; and dominant RCA with smooth 20% proximal narrowing and 60-70% mid stenosis between areas of ectasia.  Successful PCI with DES stenting to the proximal LAD, and ultimate insertion of a 3.522 mm Resolute DES stent postdilated to 3.73 mm with the 99% stenosis being reduced to 0% without evidence for dissection and with TIMI-3 flow.  RECOMMENDATION: The patient will continue with dual antiplatelet therapy. Since the patient presented with an acute coronary syndrome, he will be switched to Brilinta which has statistically significant improved efficacy compared to Plavix. Initial medical therapy for his RCA stenosis.        Assessment/Plan:   Active Problems:   ST elevation myocardial infarction involving left anterior descending (LAD) coronary artery (HCC)   ST elevation (STEMI) myocardial infarction involving left anterior descending coronary artery (HCC)  1. Day 1 s/p AMI treated with emergent cath/PCI to subtotal proximal LAD with DES stent and immediate normalization of ECG post procedure. 2. Essential HTN: prob h/o ACE-I induced cough so was on losartan; will re-institute, low dose coreg started last night post MI. 3. HLD: Lipid panel sent; to start atorvastatin post procedure.   Long discussion with patient concerning pathophysiology of Acute Coronary Syndrome , importance of anti-platelet therapy and aggressive statin use for plaque regression.  Have changed plavix to brilinta for more optimal anti-platelet Rx. Suspect patient will have complete recovery of LV function based on early normalization of ECG. Will transfer to telemetry; echo later today and possible dc tomorrow. Will need to defer varicose vein surgery which he was planning to pursue in several  months.  Time spent: 40 minutes  Lennette Biharihomas A. Chidera Dearcos, MD, [facility] 08/18/2015, 8:02 AM

## 2015-08-18 NOTE — Research (Signed)
[place] Informed Consent   Subject Name: George [facility]  Subject met inclusion and exclusion criteria.  The informed consent form, study requirements and expectations were reviewed with the subject and questions and concerns were addressed prior to the signing of the consent form.  The subject verbalized understanding of the trail requirements.  The subject agreed to participate in the [facility] trial and signed the informed consent.  The informed consent was obtained prior to performance of any protocol-specific procedures for the subject.  A copy of the signed informed consent was given to the subject and a copy was placed in the subject's medical record.  Hedrick,Tammy W 08/18/2015,11:10

## 2015-08-19 ENCOUNTER — Other Ambulatory Visit: Payer: Self-pay | Admitting: Physician Assistant

## 2015-08-19 ENCOUNTER — Telehealth: Payer: Self-pay | Admitting: Physician Assistant

## 2015-08-19 DIAGNOSIS — E785 Hyperlipidemia, unspecified: Secondary | ICD-10-CM

## 2015-08-19 MED ORDER — CARVEDILOL 3.125 MG PO TABS: 3.1250 mg | ORAL_TABLET | Freq: Two times a day (BID) | ORAL | Status: DC

## 2015-08-19 MED ORDER — ATORVASTATIN CALCIUM 80 MG PO TABS
80.0000 mg | ORAL_TABLET | Freq: Every day | ORAL | Status: DC
Start: 2015-08-19 — End: 2017-02-08

## 2015-08-19 MED ORDER — NITROGLYCERIN 0.4 MG SL SUBL: 0.4000 mg | SUBLINGUAL_TABLET | SUBLINGUAL | Status: DC | PRN

## 2015-08-19 MED ORDER — TICAGRELOR 90 MG PO TABS: 90.0000 mg | ORAL_TABLET | Freq: Two times a day (BID) | ORAL | Status: DC

## 2015-08-19 NOTE — Progress Notes (Signed)
[id number] Checked to see if pt and wife had any questions re ed done yesterday. Encouraged CRP 2  and stressed importance of brilinta with stent. Reviewed had to start walking program and answered questions. Luetta NuttingCharlene Trenden Hazelrigg RN BSN 08/19/2015 11:15 AM

## 2015-08-19 NOTE — Discharge Summary (Signed)
Discharge Summary   Patient ID: George MainsRussell Jimenez,  MRN: [id number], DOB/AGE: 58/05/1956 58 y.o.  Admit date: 08/17/2015 Discharge date: 08/19/2015  Primary Care Provider: Danella PentonMILLER,MARK F. Primary Cardiologist: Dr. Tresa EndoKelly (New)  Discharge Diagnoses Active Problems:   ST elevation myocardial infarction involving left anterior descending (LAD) coronary artery (HCC)   HTN   Diverticulitis    Hyperglycemia  Allergies Allergies  Allergen Reactions  . Ace Inhibitors Cough  . Levaquin [Levofloxacin In D5w]     Consultant: None  Procedures  Emergent Cath 08/17/15: Conclusion     Ost LAD to Prox LAD lesion, 20% stenosed.  Mid RCA lesion, 65% stenosed.  Mid LAD-2 lesion, 40% stenosed.  Mid LAD-1 lesion, 99% stenosed. Post intervention, there is a 0% residual stenosis.  There is mild left ventricular systolic dysfunction.  Mild acute LV dysfunction with an ejection fraction of 45-50% and mild mid anterolateral hypocontractility.  Two-vessel coronary obstructive disease with 20% smooth ostial narrowing of the LAD and 95-99% proximal LAD stenosis in the region of the proximal septal perforating artery followed by ectatic segment and then 40% narrowing prior to the takeoff of the first diagonal branch of the LAD; normal left circumflex coronary artery; and dominant RCA with smooth 20% proximal narrowing and 60-70% mid stenosis between areas of ectasia.  Successful PCI with DES stenting to the proximal LAD, and ultimate insertion of a 3.522 mm Resolute DES stent postdilated to 3.73 mm with the 99% stenosis being reduced to 0% without evidence for dissection and with TIMI-3 flow.  RECOMMENDATION: The patient will continue with dual antiplatelet therapy. Since the patient presented with an acute coronary syndrome, he will be switched to Brilinta which has statistically significant improved efficacy compared to Plavix. Initial medical therapy for his RCA stenosis.           Echo 08/2014 LV EF: 60% -  65%  ------------------------------------------------------------------- Indications:   MI - acute 410.91.  ------------------------------------------------------------------- History:  Risk factors: Hypertension.  ------------------------------------------------------------------- Study Conclusions  - Left ventricle: The cavity size was normal. Wall thickness was increased in a pattern of mild LVH. Systolic function was normal. The estimated ejection fraction was in the range of 60% to 65%. There is mild hypokinesis of the entireanteroseptal myocardium. Doppler parameters are consistent with abnormal left ventricular relaxation (grade 1 diastolic dysfunction).  History of Present Illness  George Jimenez is a 59 y.o. male with a history of HTN and diverticulitis who [facility] for with an acute STEMI.   [facility] for evaluation of sudden onset central nonradiating chest pain/pressure which began while he was doing back exercises at the gym. Pain was constant since onset, associated with diaphoresis and shortness of breath. He also had nausea which proceeded a fainting episode at the gym per notes from ED provider [facility]. However, patient denies passing out.   He works out everyday and prior to this week has never had any exertional symptoms. This past week he has had on and off chest pain associated with exertion but nothing as severe. First troponin negative and all other labwork unremarkable, except some mild hyperglycemia.   His first ECG showed no acute ST elevation. With 1 mm of ST depression in V3, V4. A subsequent ECG showed ST elevation in lead I, aVL, V2. ST depression in 2, 3, aVF as well as hyperacute T waves in V3-V6 and [facility] emergent cardiac catheter[facility]. He was given  1 SL NTG with no relief. He was given morphine and fentanyl  which caused marked hypotension.  He is a never smoker. He only take Losartan for HTN and was recently placed on an antibiotic for diverticulitis but he cannot recall the name. He denies use of pre workout supplements. No hx of CVA or major bleed. His father had CABG in his 67s. No other heart disease in his family.   Hospital Course  He was directly taken to cath lab s/p PCI to subtotal proximal LAD with DES stent and immediate normalization of ECG post procedure. Mild acute LV dysfunction with an ejection fraction of 45-50% and mild mid anterolateral hypocontractility. Prob h/o ACE-I induced cough so was on losartan. Added BB and statin. Changed plavix to brilinta for more optimal anti-platelet Rx. F/u echo showed normal Lv function of 60-65%, mild LVH and grade 1 DD.  The patient ambulated will with cardiac rehab without difficulty.   She has been seen by Dr.  Tresa Endo today and deemed ready for discharge home. All follow-up appointments have been scheduled. Discharge medications are listed below.   His blood glucose was elevated during admission. Consider HgbA1c check.   Discharge Vitals Blood pressure 145/88, pulse 66, temperature 97.6 F (36.4 C), temperature source Oral, resp. rate 18, height 6' (1.829 m), weight 189 lb 1.6 oz (85.775 kg), SpO2 100 %.  Filed Weights   08/17/15 1620 08/19/15 0558  Weight: 194 lb 0.1 oz (88 kg) 189 lb 1.6 oz (85.775 kg)    Labs  CBC  Recent Labs  08/17/15 1312 08/18/15 0425  WBC 8.0 5.7  NEUTROABS 4.6  --   HGB 13.8 11.8*  HCT 40.2 34.5*  MCV 86.5 86.7  PLT 185 100*   Basic Metabolic Panel  Recent Labs  08/17/15 1312 08/18/15 0425  NA 137 137  K 3.8 3.7  CL 103 108  CO2 25 20*  GLUCOSE 138* 169*  BUN 18 12  CREATININE 1.21 1.02  CALCIUM 9.1 8.3*   Cardiac Enzymes  Recent Labs  08/17/15 1312 08/17/15 1632  TROPONINI <0.03 0.64*   Fasting Lipid Panel  Recent Labs  08/18/15 0425  CHOL 149  HDL 29*  LDLCALC 94  TRIG 161   CHOLHDL 5.1    Disposition  Pt is being discharged home today in good condition.  Follow-up Plans & Appointments  Follow-up Information    Follow up with [facility]. Go on 08/25/2015.   Specialty:  Cardiology   Why:  :30 am for TCM With Darrol Jump, PA-C   Contact information:   [address] [place] [address] [phone]          Discharge Instructions    Amb Referral to Cardiac Rehabilitation    Complete by:  As directed   Diagnosis:   Myocardial Infarction PCI       Diet - low sodium heart healthy    Complete by:  As directed      Discharge instructions    Complete by:  As directed   No driving for 2 weeks. No lifting over 10 lbs for 4 weeks. No sexual activity for 4 weeks. You may not return to work until cleared by your cardiologist 08/29/2015. Keep procedure site clean & dry. If you notice increased pain, swelling, bleeding or pus, call/return!  You may shower, but no soaking baths/hot tubs/pools for 1 week.     Increase activity slowly    Complete by:  As directed  F/u Labs/Studies: Consider OP f/u labs 6-8 weeks given statin initiation this admission. HgbA1c for hyperglycemia.   Discharge Medications    Medication List    TAKE these medications        aspirin EC 81 MG tablet  Take 81 mg by mouth daily.     atorvastatin 80 MG tablet  Commonly known as:  LIPITOR  Take 1 tablet (80 mg total) by mouth daily at 6 PM.     carvedilol 3.125 MG tablet  Commonly known as:  COREG  Take 1 tablet (3.125 mg total) by mouth 2 (two) times daily with a meal.     cefUROXime 500 MG tablet  Commonly known as:  CEFTIN  Take 500 mg by mouth 2 (two) times daily. 10 day supply started 12/5     losartan 50 MG tablet  Commonly known as:  COZAAR  Take 50 mg by mouth daily.     multivitamin with minerals Tabs tablet  Take 1 tablet by mouth daily.     nitroGLYCERIN 0.4 MG SL tablet  Commonly known as:   NITROSTAT  Place 1 tablet (0.4 mg total) under the tongue every 5 (five) minutes as needed for chest pain.     ticagrelor 90 MG Tabs tablet  Commonly known as:  BRILINTA  Take 1 tablet (90 mg total) by mouth 2 (two) times daily.        Duration of Discharge Encounter   Greater than 30 minutes including physician time.  Signed, Haji Delaine PA-C 08/19/2015, 5:36 PM

## 2015-08-19 NOTE — Progress Notes (Signed)
Subjective:  Day 2 s/p STEMI; no cp or dyspnea  Objective:   Vital Signs : Filed Vitals:   08/18/15 2033 08/19/15 0015 08/19/15 0558 08/19/15 0755  BP: 127/76 131/71 130/74 145/88  Pulse: 63 61 70 66  Temp: 98.2 F (36.8 C) 98.2 F (36.8 C) 97.8 F (36.6 C) 97.6 F (36.4 C)  TempSrc: Oral Oral Oral Oral  Resp: Height: 6' (1.829 m)     Weight:   189 lb 1.6 oz (85.775 kg)   SpO2: 97% 98% 99% 100%    Intake/Output from previous day: +463  Intake/Output Summary (Last 24 hours) at 08/19/15 1032 Last data filed at 08/19/15 0915  Gross per 24 hour  Intake    720 ml  Output    700 ml  Net     20 ml    I/O since admission:  Wt Readings from Last 3 Encounters:  08/19/15 189 lb 1.6 oz (85.775 kg)  08/17/15 192 lb (87.091 kg)    Medications: . aspirin EC  81 mg Oral Daily  . atorvastatin  80 mg Oral q1800  . carvedilol  3.125 mg Oral BID WC  . cefUROXime  500 mg Oral BID WC  . losartan  25 mg Oral Daily  . sodium chloride  3 mL Intravenous Q12H  . ticagrelor  90 mg Oral BID        Physical Exam:   General appearance: well developed, muscular, alert, cooperative and no distress Neck: no adenopathy, no carotid bruit, no JVD, supple, symmetrical, trachea midline and thyroid not enlarged, symmetric, no tenderness/mass/nodules Lungs: clear to auscultation bilaterally Heart: regular rate and rhythm and no rub Abdomen: soft, non-tender; bowel sounds normal; no masses,  no organomegaly Extremities: no edema, redness or tenderness in the calves or thighs Pulses: 2+ and symmetric Skin: Skin color, texture, turgor normal. No rashes or lesions Neurologic: Grossly normal   Rate: 63  Rhythm: normal sinus rhythm  08/18/15 ECG (independently read by me): NSR at 67; normal  08/17/15 ECG post PCI (independently read by me): NSR at 75; normal  08/17/15 ECG pre procedure (independently read by me): SB at 49 ST depression 2,3, aVF, V5-6 with hyperacute T waves I,  aVLV2-4  Lab Results:   Recent Labs  08/17/15 1312 08/18/15 0425  NA 137 137  K 3.8 3.7  CL 103 108  CO2 25 20*  GLUCOSE 138* 169*  BUN 18 12  CREATININE 1.21 1.02  CALCIUM 9.1 8.3*    No flowsheet data found.   Recent Labs  08/17/15 1312 08/18/15 0425  WBC 8.0 5.7  NEUTROABS 4.6  --   HGB 13.8 11.8*  HCT 40.2 34.5*  MCV 86.5 86.7  PLT 185 100*     Recent Labs  08/17/15 1312 08/17/15 1632  TROPONINI <0.03 0.64*    No results found for: TSH No results for input(s): HGBA1C in the last 72 hours.  No results for input(s): PROT, ALBUMIN, AST, ALT, ALKPHOS, BILITOT, BILIDIR, IBILI in the last 72 hours.  Recent Labs  08/17/15 1312  INR 1.06   BNP (last 3 results) No results for input(s): BNP in the last 8760 hours.  ProBNP (last 3 results) No results for input(s): PROBNP in the last 8760 hours.   Lipid Panel     Component Value Date/Time   CHOL 149 08/18/2015 0425   TRIG 130 08/18/2015 0425   HDL 29* 08/18/2015 0425   CHOLHDL 5.1 08/18/2015 0425  VLDL 26 08/18/2015 0425   LDLCALC 94 08/18/2015 0425     Imaging:  Dg Chest Portable 1 View  08/17/2015  CLINICAL DATA:  Syncopal episode while working out. Chest pain for 1 week. EXAM: PORTABLE CHEST 1 VIEW COMPARISON:  None. FINDINGS: Normal cardiac silhouette and mediastinal contours given decreased lung volumes and AP projection. Minimal bilateral infrahilar opacities favored to represent atelectasis. No pleural effusion or pneumothorax. No evidence of edema. No acute osseus abnormalities. IMPRESSION: No definite acute cardiopulmonary disease on this AP portable examination. Further evaluation with a PA and lateral chest radiograph may be obtained as clinically indicated. Electronically Signed   By: Simonne ComeJohn  Watts M.D.   On: 08/17/2015 13:54   Emergent Cath 08/17/15: Conclusion     Ost LAD to Prox LAD lesion, 20% stenosed.  Mid RCA lesion, 65% stenosed.  Mid LAD-2 lesion, 40% stenosed.  Mid  LAD-1 lesion, 99% stenosed. Post intervention, there is a 0% residual stenosis.  There is mild left ventricular systolic dysfunction.  Mild acute LV dysfunction with an ejection fraction of 45-50% and mild mid anterolateral hypocontractility.  Two-vessel coronary obstructive disease with 20% smooth ostial narrowing of the LAD and 95-99% proximal LAD stenosis in the region of the proximal septal perforating artery followed by ectatic segment and then 40% narrowing prior to the takeoff of the first diagonal branch of the LAD; normal left circumflex coronary artery; and dominant RCA with smooth 20% proximal narrowing and 60-70% mid stenosis between areas of ectasia.  Successful PCI with DES stenting to the proximal LAD, and ultimate insertion of a 3.522 mm Resolute DES stent postdilated to 3.73 mm with the 99% stenosis being reduced to 0% without evidence for dissection and with TIMI-3 flow.  RECOMMENDATION: The patient will continue with dual antiplatelet therapy. Since the patient presented with an acute coronary syndrome, he will be switched to Brilinta which has statistically significant improved efficacy compared to Plavix. Initial medical therapy for his RCA stenosis.      08/18/15 ECHO Study Conclusions:  - Left ventricle: The cavity size was normal. Wall thickness was increased in a pattern of mild LVH. Systolic function was normal. The estimated ejection fraction was in the range of 60% to 65%. There is mild hypokinesis of the entireanteroseptal myocardium. Doppler parameters are consistent with abnormal left ventricular relaxation (grade 1 diastolic dysfunction).   Assessment/Plan:   Active Problems:   ST elevation myocardial infarction involving left anterior descending (LAD) coronary artery (HCC)   ST elevation (STEMI) myocardial infarction involving left anterior descending coronary artery (HCC)  1. Day 2 s/p AMI treated with emergent cath/PCI to subtotal  proximal LAD with DES stent and immediate normalization of ECG post procedure. 2. Essential HTN: prob h/o ACE-I induced cough so was on losartan; will re-institute, low dose coreg started last night post MI. 3. HLD: Lipid panel sent; to start atorvastatin post procedure.   Yesterday I had a long discussion with patient concerning the pathophysiology of Acute Coronary Syndrome , importance of anti-platelet therapy and aggressive statin use for plaque regression.  Now on brillinta for anti-platelet therapy. Discussed potential 14% chance of mild transient dyspnea related to adenosine mechanism.  Echo data supports normalization of LV function with mild residual anteroseptal hypokinesis which may still having mild stunning effect. Lipids reviewed with patient; now on post MI dose of atorvastatin per [facility].  Plan to increase losartan back to 50 mg home dose beginning tomorrow. Plan to dc today.Pt wishes to f/u with me in  [place] office rather than be seen in [place].   Lennette Bihari, MD, [facility] 08/19/2015, 10:32 AM

## 2015-08-19 NOTE — Discharge Summary (Signed)
Pt got discharged to home, discharge instructions provided and patient showed understanding to it, IV taken out,Telemonitor DC,pt left unit in wheelchair with all of the belongings accompanied with a family member (wife) 

## 2015-08-19 NOTE — Telephone Encounter (Signed)
New message      TCM appt on 12-22 with Rhonda per vinn

## 2015-08-23 NOTE — Telephone Encounter (Signed)
[facility].  Patient understands to follow up with provider Theodore Demarkhonda Barrett on 12/22 at 10:[facility]. Patient understands discharge instructions? Yes Patient understands medications and regiment? Yes Patient understands to bring all medications to this visit? Yes

## 2015-08-25 ENCOUNTER — Encounter: Payer: Self-pay | Admitting: Physician Assistant

## 2015-08-25 ENCOUNTER — Ambulatory Visit (INDEPENDENT_AMBULATORY_CARE_PROVIDER_SITE_OTHER): Payer: BLUE CROSS/BLUE SHIELD | Admitting: Physician Assistant

## 2015-08-25 VITALS — BP 126/90 | HR 65 | Ht 72.0 in | Wt 190.5 lb

## 2015-08-25 DIAGNOSIS — Z79899 Other long term (current) drug therapy: Secondary | ICD-10-CM | POA: Diagnosis not present

## 2015-08-25 DIAGNOSIS — I255 Ischemic cardiomyopathy: Secondary | ICD-10-CM

## 2015-08-25 DIAGNOSIS — E785 Hyperlipidemia, unspecified: Secondary | ICD-10-CM

## 2015-08-25 DIAGNOSIS — I213 ST elevation (STEMI) myocardial infarction of unspecified site: Secondary | ICD-10-CM | POA: Diagnosis not present

## 2015-08-25 NOTE — Patient Instructions (Signed)
Medication Instructions:  Please continue your current medications  Labwork: Your physician recommends that you return for lab work in 3 months - FASTING.  Testing/Procedures: 1. 2D Echocardiogram - Your physician has requested that you have an echocardiogram. Echocardiography is a painless test that uses sound waves to create images of your heart. It provides your doctor with information about the size and shape of your heart and how well your heart's chambers and valves are working. This procedure takes approximately one hour. There are no restrictions for this procedure. This has been ordered to be done at [facility] location - [address], [address].  Follow-Up: Theodore DemarkRhonda Barrett, PA-C, recommends that you schedule a follow-up appointment in 3 months with Dr Tresa EndoKelly.  If you need a refill on your cardiac medications before your next appointment, please call your pharmacy.

## 2015-08-25 NOTE — Progress Notes (Signed)
Cardiology Office Note   Date:  08/25/2015   ID:  George Jimenez, DOB 1956/07/07, MRN [id number]  PCP:  Danella Penton., MD  Cardiologist:  Dr Venora Maples, PA-C   Chief Complaint  Patient presents with  . Follow-up    ED/STEMI//pt c/o mild lightheadedness after taking Carvedilol    History of Present Illness: George Jimenez is a 59 y.o. male with a history of HTN. He was admitTED 12/14-12/16 for STEMI w/ DES LAD and med rx for 40% RCA. EF 45-50%   George Jimenez presents for post hospital follow-up.  Since discharge from the hospital, he has not had any chest pain. He is trying to follow cardiac rehabilitation instructions for increasing his activity. He ate a fairly healthy diet prior to admission, but admits that he eats too much sweets. He has not had lower extremity edema, orthopnea or PND.  He is taking his medications as prescribed, but reports being lightheaded and hour or so after he takes his medication. The lightheaded feeling his orthostatic in nature and only lasts about 15 minutes.  At work he walks a great deal but does not necessarily have to lift anything heavy. He was stenosed he can go back to work.  He was stenosed he needs to do anything about the 40% RCA lesion and if that can cause him chest pain.   Past Medical History  Diagnosis Date  . HTN (hypertension)   . Diverticulitis   . Dyslipidemia     low HDL  . STEMI (ST elevation myocardial infarction) (HCC) 08/17/2015    DES LAD    Past Surgical History  Procedure Laterality Date  . Appendectomy    . Cardiac catheterization N/A 08/17/2015    Procedure: Left Heart Cath and Coronary Angiography;  Surgeon: Lennette Bihari, MD; LAD 99 & 40%, RCA 40%, EF 45-50%   . Cardiac catheterization Right 08/17/2015    Procedure: Coronary Stent Intervention;  Surgeon: Lennette Bihari, MD; mLAD 99%>0 w/ 3.5 x 22 mm Resolute DES; Laterality: Right     Current Outpatient Prescriptions  Medication Sig Dispense  Refill  . aspirin EC 81 MG tablet Take 81 mg by mouth daily.    Marland Kitchen atorvastatin (LIPITOR) 80 MG tablet Take 1 tablet (80 mg total) by mouth daily at 6 PM. 30 tablet 11  . carvedilol (COREG) 3.125 MG tablet Take 1 tablet (3.125 mg total) by mouth 2 (two) times daily with a meal. 30 tablet 11  . losartan (COZAAR) 50 MG tablet Take 50 mg by mouth daily.  12  . Multiple Vitamin (MULTIVITAMIN WITH MINERALS) TABS tablet Take 1 tablet by mouth daily.    . nitroGLYCERIN (NITROSTAT) 0.4 MG SL tablet Place 1 tablet (0.4 mg total) under the tongue every 5 (five) minutes as needed for chest pain. 25 tablet 12  . ticagrelor (BRILINTA) 90 MG TABS tablet Take 1 tablet (90 mg total) by mouth 2 (two) times daily. 60 tablet 6   No current facility-administered medications for this visit.    Allergies:   Ace inhibitors and Levaquin    Social History:  The patient  reports that he has never smoked. He does not have any smokeless tobacco history on file. He reports that he does not drink alcohol.   Family History:  The patient's family history includes Heart attack in his father.    ROS:  Please see the history of present illness. All other systems are reviewed and negative.    PHYSICAL  EXAM: VS:  BP 126/90 mmHg  Pulse 65  Ht 6' (1.829 m)  Wt 190 lb 8 oz (86.41 kg)  BMI 25.83 kg/m2 , BMI Body mass index is 25.83 kg/(m^2). GEN: Well nourished, well developed, male in no acute distress HEENT: normal for age  Neck: no JVD, no carotid bruit, no masses Cardiac: RRR; no murmur, no rubs, or gallops Respiratory:  clear to auscultation bilaterally, normal work of breathing GI: soft, nontender, nondistended, + BS MS: no deformity or atrophy; no edema; distal pulses are 2+ in all 4 extremities  Skin: warm and dry, no rash Neuro:  Strength and sensation are intact Psych: euthymic mood, full affect   EKG:  EKG is ordered today. The ekg ordered today demonstrates sinus rhythm, rate 65, no Q waves noted and no  abnormal intervals.  CATH:   Ost LAD to Prox LAD lesion, 20% stenosed.  Mid RCA lesion, 65% stenosed.  Mid LAD-2 lesion, 40% stenosed.  Mid LAD-1 lesion, 99% stenosed. Post intervention, there is a 0% residual stenosis.  There is mild left ventricular systolic dysfunction.  Mild acute LV dysfunction with an ejection fraction of 45-50% and mild mid anterolateral hypocontractility.  Two-vessel coronary obstructive disease with 20% smooth ostial narrowing of the LAD and 95-99% proximal LAD stenosis in the region of the proximal septal perforating artery followed by ectatic segment and then 40% narrowing prior to the takeoff of the first diagonal branch of the LAD; normal left circumflex coronary artery; and dominant RCA with smooth 20% proximal narrowing and 60-70% mid stenosis between areas of ectasia.  Successful PCI with DES stenting to the proximal LAD, and ultimate insertion of a 3.522 mm Resolute DES stent postdilated to 3.73 mm with the 99% stenosis being reduced to 0% without evidence for dissection and with TIMI-3 flow.  Recent Labs: 08/18/2015: BUN 12; Creatinine, Ser 1.02; Hemoglobin 11.8*; Platelets 100*; Potassium 3.7; Sodium 137    Lipid Panel    Component Value Date/Time   CHOL 149 08/18/2015 0425   TRIG 130 08/18/2015 0425   HDL 29* 08/18/2015 0425   CHOLHDL 5.1 08/18/2015 0425   VLDL 26 08/18/2015 0425   LDLCALC 94 08/18/2015 0425     Wt Readings from Last 3 Encounters:  08/25/15 190 lb 8 oz (86.41 kg)  08/19/15 189 lb 1.6 oz (85.775 kg)  08/17/15 192 lb (87.091 kg)     Other studies Reviewed: Additional studies/ records that were reviewed today include: Hospital records.  ASSESSMENT AND PLAN:  1.  STEMI, DES to the LAD with medical therapy for RCA 40%: He is on aspirin, high-dose statin, ARB and beta blocker. His blood pressure and heart rate are not high enough to increase these doses. I suggested he take the ARB with his evening medications instead of  with his morning medications to minimize side effects. He will do this. He was asking which of the medications will have to be continued long-term. I advised him that most of them would be long-term medications but he might be able to come off the Brilinta after year. He understands the importance of compliance with taking it twice a day.  2. Ischemic cardiomyopathy: He does not have any current signs or symptoms of volume overload. We will recheck an echocardiogram in 3 months.  3. Dyslipidemia: He is on high-dose statin. We will check a lipid profile and liver function testing in 3 months when he comes for his echocardiogram.  [place] they live in [place], they really like Dr.  Tresa Endo and would prefer to come back to [place] to see him in the future.  Current medicines are reviewed at length with the patient today.  The patient has concerns regarding medicines. All concerns were addressed.  The following changes have been made:  no change  Labs/ tests ordered today include:   Orders Placed This Encounter  Procedures  . Lipid panel  . Comprehensive metabolic panel  . ECHOCARDIOGRAM COMPLETE     Disposition:   FU with Dr. Tresa Endo  Signed, Leanna Battles  08/25/2015 1:00 PM    [facility] [address] [place], [place], [place]  [address] Phone: [phone]; Fax: [phone]

## 2015-08-26 ENCOUNTER — Telehealth: Payer: Self-pay | Admitting: *Deleted

## 2015-08-26 NOTE — Telephone Encounter (Signed)
I spoke to patient to let him know he does not have the genotype. Patient will not qualify for the Dal-Gene study. i thanked patient for his willingness to participate.

## 2015-08-26 NOTE — Telephone Encounter (Signed)
Done in error.

## 2015-09-06 ENCOUNTER — Encounter: Payer: BLUE CROSS/BLUE SHIELD | Attending: Cardiovascular Disease | Admitting: *Deleted

## 2015-09-06 VITALS — Ht 73.25 in | Wt 187.7 lb

## 2015-09-06 DIAGNOSIS — I213 ST elevation (STEMI) myocardial infarction of unspecified site: Secondary | ICD-10-CM | POA: Diagnosis not present

## 2015-09-06 DIAGNOSIS — Z9861 Coronary angioplasty status: Secondary | ICD-10-CM | POA: Diagnosis present

## 2015-09-06 NOTE — Patient Instructions (Signed)
Patient Instructions  Patient Details  Name: George Jimenez MRN: [id number] Date of Birth: 1956-05-02 Referring Provider:  Lennette Bihari, MD  Below are the personal goals you chose as well as exercise and nutrition goals. Our goal is to help you keep on track towards obtaining and maintaining your goals. We will be discussing your progress on these goals with you throughout the program.  Initial Exercise Prescription:     Initial Exercise Prescription - 09/06/15 1000    Date of Initial Exercise Prescription   Date 09/06/15   Treadmill   MPH 3   Grade 0   Minutes 10   Bike   Level 0.4   Minutes 15   Recumbant Bike   Level 4   RPM 45   Watts 30   Minutes 15   NuStep   Level 3   Watts 30   Minutes 15   Arm Ergometer   Level 1   Watts 8   Minutes 10   Arm/Foot Ergometer   Level 4   Watts 12   Minutes 10   Cybex   Level 3   RPM 50   Minutes 15   Recumbant Elliptical   Level 2   RPM 40   Watts 15   Minutes 15   Elliptical   Level 1   Speed 3   Minutes 1   REL-XR   Level 3   Watts 45   Minutes 15   T5 Nustep   Level 2   Watts 20   Minutes 15   Biostep-RELP   Level 3   Watts 20   Minutes 15   Prescription Details   Frequency (times per week) 3   Duration Progress to 30 minutes of continuous aerobic without signs/symptoms of physical distress   Intensity   THRR REST +  30   Ratings of Perceived Exertion 11-15   Progression Continue progressive overload as per policy without signs/symptoms or physical distress.   Resistance Training   Training Prescription Yes   Weight 3   Reps 10-15      Exercise Goals: Frequency: Be able to perform aerobic exercise three times per week working toward 3-5 days per week.  Intensity: Work with a perceived exertion of 11 (fairly light) - 15 (hard) as tolerated. Follow your new exercise prescription and watch for changes in prescription as you progress with the program. Changes will be reviewed with you when they are  made.  Duration: You should be able to do 30 minutes of continuous aerobic exercise in addition to a 5 minute warm-up and a 5 minute cool-down routine.  Nutrition Goals: Your personal nutrition goals will be established when you do your nutrition analysis with the dietician.  The following are nutrition guidelines to follow: Cholesterol < 200mg /day Sodium < 1500mg /day Fiber: Men over 50 yrs - 30 grams per day  Personal Goals:     Personal Goals and Risk Factors at Admission - 09/06/15 0854    Personal Goals and Risk Factors on Admission   Increase Aerobic Exercise and Physical Activity Yes   Intervention While in program, learn and follow the exercise prescription taught. Start at a low level workload and increase workload after able to maintain previous level for 30 minutes. Increase time before increasing intensity.  Was working out every day, working with  aerobic weight training.  HIgh intensity/low weights.  prior to heart attack.  Would like to return to this and more walking; at least 30 minutes  a day.    Take Less Medication Yes   Intervention Learn your risk factors and begin the lifestyle modifications for risk factor control during your time in the program.   Understand more about Heart/Pulmonary Disease. Yes   Intervention While in program utilize professionals for any questions, and attend the education sessions. Great websites to use are www.americanheart.org or www.lung.org for reliable information.   Hypertension Yes   Goal Participant will see blood pressure controlled within the values of 140/3090mm/Hg or within value directed by their physician.   Intervention Provide nutrition & aerobic exercise along with prescribed medications to achieve BP 140/90 or less.   Lipids Yes   Goal Cholesterol controlled with medications as prescribed, with individualized exercise RX and with personalized nutrition plan. Value goals: LDL < 70mg , HDL > 40mg . Participant states understanding  of desired cholesterol values and following prescriptions.   Intervention Provide nutrition & aerobic exercise along with prescribed medications to achieve LDL 70mg , HDL >40mg .   Stress Yes   Goal To meet with psychosocial counselor for stress and relaxation information and guidance. To state understanding of performing relaxation techniques and or identifying personal stressors.   Intervention Provide education on types of stress, identifiying stressors, and ways to cope with stress. Provide demonstration and active practice of relaxation techniques.  Stress at work. Has been lowered since returned to work.   [employer] duties have changed to alleviate the major work stress.  This has made a huge difference for [name].       Tobacco Use Initial Evaluation: History  Smoking status  . Never Smoker   Smokeless tobacco  . Not on file    Copy of goals given to participant.

## 2015-09-06 NOTE — Progress Notes (Signed)
Cardiac Individual Treatment Plan  Patient Details  Name: George Jimenez MRN: [id number] Date of Birth: 08/29/1956 Referring Provider:  Lennette Bihari, MD  Initial Encounter Date: Date: 09/06/15  Visit Diagnosis: ST elevation myocardial infarction (STEMI), unspecified artery (HCC)  S/P PTCA (percutaneous transluminal coronary angioplasty)  Patient's Home Medications on Admission:  Current outpatient prescriptions:  .  aspirin EC 81 MG tablet, Take 81 mg by mouth daily., Disp: , Rfl:  .  atorvastatin (LIPITOR) 80 MG tablet, Take 1 tablet (80 mg total) by mouth daily at 6 PM., Disp: 30 tablet, Rfl: 11 .  carvedilol (COREG) 3.125 MG tablet, Take 1 tablet (3.125 mg total) by mouth 2 (two) times daily with a meal., Disp: 30 tablet, Rfl: 11 .  losartan (COZAAR) 50 MG tablet, Take 50 mg by mouth daily., Disp: , Rfl: 12 .  nitroGLYCERIN (NITROSTAT) 0.4 MG SL tablet, Place 1 tablet (0.4 mg total) under the tongue every 5 (five) minutes as needed for chest pain., Disp: 25 tablet, Rfl: 12 .  ticagrelor (BRILINTA) 90 MG TABS tablet, Take 1 tablet (90 mg total) by mouth 2 (two) times daily., Disp: 60 tablet, Rfl: 6 .  Multiple Vitamin (MULTIVITAMIN WITH MINERALS) TABS tablet, Take 1 tablet by mouth daily. Reported on 09/06/2015, Disp: , Rfl:   Past Medical History: Past Medical History  Diagnosis Date  . HTN (hypertension)   . Diverticulitis   . Dyslipidemia     low HDL  . STEMI (ST elevation myocardial infarction) (HCC) 08/17/2015    DES LAD    Tobacco Use: History  Smoking status  . Never Smoker   Smokeless tobacco  . Not on file    Labs: Recent Review Flowsheet Data    Labs for ITP Cardiac and Pulmonary Rehab Latest Ref Rng 08/18/2015   Cholestrol 0 - 200 mg/dL 409   LDLCALC 0 - 99 mg/dL 94   HDL >81 mg/dL 19(J)   Trlycerides <478 mg/dL 295       Exercise Target Goals: Date: 09/06/15  Exercise Program Goal: Individual exercise prescription set with THRR, safety & activity  barriers. Participant demonstrates ability to understand and report RPE using BORG scale, to self-measure pulse accurately, and to acknowledge the importance of the exercise prescription.  Exercise Prescription Goal: Starting with aerobic activity 30 plus minutes a day, 3 days per week for initial exercise prescription. Provide home exercise prescription and guidelines that participant acknowledges understanding prior to discharge.  Activity Barriers & Risk Stratification:     Activity Barriers & Risk Stratification - 09/06/15 0853    Activity Barriers & Risk Stratification   Activity Barriers None   Risk Stratification High      6 Minute Walk:     6 Minute Walk      09/06/15 1034       6 Minute Walk   Phase Initial     Distance 1743 feet     Walk Time 6 minutes     Resting HR 66 bpm     Resting BP 126/88 mmHg     Max Ex. HR 87 bpm     Max Ex. BP 148/88 mmHg     RPE 13     Symptoms No        Initial Exercise Prescription:     Initial Exercise Prescription - 09/06/15 1000    Date of Initial Exercise Prescription   Date 09/06/15   Treadmill   MPH 3   Grade 0   Minutes 10  Bike   Level 0.4   Minutes 15   Recumbant Bike   Level 4   RPM 45   Watts 30   Minutes 15   NuStep   Level 3   Watts 30   Minutes 15   Arm Ergometer   Level 1   Watts 8   Minutes 10   Arm/Foot Ergometer   Level 4   Watts 12   Minutes 10   Cybex   Level 3   RPM 50   Minutes 15   Recumbant Elliptical   Level 2   RPM 40   Watts 15   Minutes 15   Elliptical   Level 1   Speed 3   Minutes 1   REL-XR   Level 3   Watts 45   Minutes 15   T5 Nustep   Level 2   Watts 20   Minutes 15   Biostep-RELP   Level 3   Watts 20   Minutes 15   Prescription Details   Frequency (times per week) 3   Duration Progress to 30 minutes of continuous aerobic without signs/symptoms of physical distress   Intensity   THRR REST +  30   Ratings of Perceived Exertion 11-15   Progression  Continue progressive overload as per policy without signs/symptoms or physical distress.   Resistance Training   Training Prescription Yes   Weight 3   Reps 10-15      Exercise Prescription Changes:   Discharge Exercise Prescription (Final Exercise Prescription Changes):   Nutrition:  Target Goals: Understanding of nutrition guidelines, daily intake of sodium 1500mg , cholesterol 200mg , calories 30% from fat and 7% or less from saturated fats, daily to have 5 or more servings of fruits and vegetables.  Biometrics:     Pre Biometrics - 09/06/15 1033    Pre Biometrics   Height 6' 1.25" (1.861 m)   Weight 187 lb 11.2 oz (85.14 kg)   Waist Circumference 35 inches   Hip Circumference 40 inches   Waist to Hip Ratio 0.88 %   BMI (Calculated) 24.6       Nutrition Therapy Plan and Nutrition Goals:   Nutrition Discharge: Rate Your Plate Scores:   Nutrition Goals Re-Evaluation:   Psychosocial: Target Goals: Acknowledge presence or absence of depression, maximize coping skills, provide positive support system. Participant is able to verbalize types and ability to use techniques and skills needed for reducing stress and depression.  Initial Review & Psychosocial Screening:     Initial Psych Review & Screening - 09/06/15 0907    Family Dynamics   Good Support System? Yes   Barriers   Psychosocial barriers to participate in program There are no identifiable barriers or psychosocial needs.;The patient should benefit from training in stress management and relaxation.   Screening Interventions   Interventions Encouraged to exercise      Quality of Life Scores:   PHQ-9:     Recent Review Flowsheet Data    Depression screen [facility] 2/9 09/06/2015   Decreased Interest 0   Down, Depressed, Hopeless 0   PHQ - 2 Score 0   Altered sleeping 0   Tired, decreased energy 0   Change in appetite 0   Feeling bad or failure about yourself  0   Trouble concentrating 0   Moving  slowly or fidgety/restless 0   Suicidal thoughts 0   PHQ-9 Score 0   Difficult doing work/chores Not difficult at all  Psychosocial Evaluation and Intervention:   Psychosocial Re-Evaluation:   Vocational Rehabilitation: Provide vocational rehab assistance to qualifying candidates.   Vocational Rehab Evaluation & Intervention:     Vocational Rehab - 09/06/15 0854    Initial Vocational Rehab Evaluation & Intervention   Assessment shows need for Vocational Rehabilitation No      Education: Education Goals: Education classes will be provided on a weekly basis, covering required topics. Participant will state understanding/return demonstration of topics presented.  Learning Barriers/Preferences:     Learning Barriers/Preferences - 09/06/15 0854    Learning Barriers/Preferences   Learning Barriers None   Learning Preferences Written Material      Education Topics: General Nutrition Guidelines/Fats and Fiber: -Group instruction provided by verbal, written material, models and posters to present the general guidelines for heart healthy nutrition. Gives an explanation and review of dietary fats and fiber.   Controlling Sodium/Reading Food Labels: -Group verbal and written material supporting the discussion of sodium use in heart healthy nutrition. Review and explanation with models, verbal and written materials for utilization of the food label.   Exercise Physiology & Risk Factors: - Group verbal and written instruction with models to review the exercise physiology of the cardiovascular system and associated critical values. Details cardiovascular disease risk factors and the goals associated with each risk factor.   Aerobic Exercise & Resistance Training: - Gives group verbal and written discussion on the health impact of inactivity. On the components of aerobic and resistive training programs and the benefits of this training and how to safely progress through these  programs.   Flexibility, Balance, General Exercise Guidelines: - Provides group verbal and written instruction on the benefits of flexibility and balance training programs. Provides general exercise guidelines with specific guidelines to those with heart or lung disease. Demonstration and skill practice provided.   Stress Management: - Provides group verbal and written instruction about the health risks of elevated stress, cause of high stress, and healthy ways to reduce stress.   Depression: - Provides group verbal and written instruction on the correlation between heart/lung disease and depressed mood, treatment options, and the stigmas associated with seeking treatment.   Anatomy & Physiology of the Heart: - Group verbal and written instruction and models provide basic cardiac anatomy and physiology, with the coronary electrical and arterial systems. Review of: AMI, Angina, Valve disease, Heart Failure, Cardiac Arrhythmia, Pacemakers, and the ICD.   Cardiac Procedures: - Group verbal and written instruction and models to describe the testing methods done to diagnose heart disease. Reviews the outcomes of the test results. Describes the treatment choices: Medical Management, Angioplasty, or Coronary Bypass Surgery.   Cardiac Medications: - Group verbal and written instruction to review commonly prescribed medications for heart disease. Reviews the medication, class of the drug, and side effects. Includes the steps to properly store meds and maintain the prescription regimen.   Go Sex-Intimacy & Heart Disease, Get SMART - Goal Setting: - Group verbal and written instruction through game format to discuss heart disease and the return to sexual intimacy. Provides group verbal and written material to discuss and apply goal setting through the application of the S.M.A.R.T. Method.   Other Matters of the Heart: - Provides group verbal, written materials and models to describe Heart  Failure, Angina, Valve Disease, and Diabetes in the realm of heart disease. Includes description of the disease process and treatment options available to the cardiac patient.   Exercise & Equipment Safety: - Individual verbal instruction and demonstration of  equipment use and safety with use of the equipment.          [facility]   Date  09/06/15   Educator  SB   Instruction Review Code  2- meets goals/outcomes      Infection Prevention: - Provides verbal and written material to individual with discussion of infection control including proper hand washing and proper equipment cleaning during exercise session.      [facility]   Date  09/06/15   Educator  SB   Instruction Review Code  2- meets goals/outcomes      Falls Prevention: - Provides verbal and written material to individual with discussion of falls prevention and safety.      [facility]   Date  09/06/15   Educator  Sb   Instruction Review Code  2- meets goals/outcomes      Diabetes: - Individual verbal and written instruction to review signs/symptoms of diabetes, desired ranges of glucose level fasting, after meals and with exercise. Advice that pre and post exercise glucose checks will be done for 3 sessions at entry of program.    Knowledge Questionnaire Score:   Personal Goals and Risk Factors at Admission:     Personal Goals and Risk Factors at Admission - 09/06/15 0854    Personal Goals and Risk Factors on Admission   Increase Aerobic Exercise and Physical Activity Yes   Intervention While in program, learn and follow the exercise prescription taught. Start at a low level workload and increase workload after able to maintain previous level for 30 minutes. Increase time before increasing intensity.  Was working out every day, working with  aerobic  weight training.  HIgh intensity/low weights.  prior to heart attack.  Would like to return to this and more walking; at least 30 minutes a day.    Take Less Medication Yes   Intervention Learn your risk factors and begin the lifestyle modifications for risk factor control during your time in the program.   Understand more about Heart/Pulmonary Disease. Yes   Intervention While in program utilize professionals for any questions, and attend the education sessions. Great websites to use are www.americanheart.org or www.lung.org for reliable information.   Hypertension Yes   Goal Participant will see blood pressure controlled within the values of 140/6490mm/Hg or within value directed by their physician.   Intervention Provide nutrition & aerobic exercise along with prescribed medications to achieve BP 140/90 or less.   Lipids Yes   Goal Cholesterol controlled with medications as prescribed, with individualized exercise RX and with personalized nutrition plan. Value goals: LDL < 70mg , HDL > 40mg . Participant states understanding of desired cholesterol values and following prescriptions.   Intervention Provide nutrition & aerobic exercise along with prescribed medications to achieve LDL 70mg , HDL >40mg .   Stress Yes   Goal To meet with psychosocial counselor for stress and relaxation information and guidance. To state understanding of performing relaxation techniques and or identifying personal stressors.   Intervention Provide education on types of stress, identifiying stressors, and ways to cope with stress. Provide demonstration and active practice of relaxation techniques.  Stress at work. Has been lowered since returned to work.   [employer] duties have changed to alleviate the major work stress.  This has made a huge difference for Clide.       Personal Goals and Risk Factors  Review:    Personal Goals Discharge (Final Personal Goals and Risk Factors Review):    ITP Comments:     ITP Comments       09/06/15 1223           ITP Comments Initial ITP Continue with ITP          Comments:

## 2015-09-09 ENCOUNTER — Encounter: Payer: BLUE CROSS/BLUE SHIELD | Admitting: *Deleted

## 2015-09-09 DIAGNOSIS — I213 ST elevation (STEMI) myocardial infarction of unspecified site: Secondary | ICD-10-CM | POA: Diagnosis not present

## 2015-09-09 DIAGNOSIS — Z9861 Coronary angioplasty status: Secondary | ICD-10-CM

## 2015-09-09 NOTE — Progress Notes (Signed)
Daily Session Note  Patient Details  Name: George Jimenez MRN: [id number] Date of Birth: 11-12-1955 Referring Provider:  [name], MD  Encounter Date: 09/09/2015  Check In:      Goals Met:  Exercise tolerated well Personal goals reviewed No report of cardiac concerns or symptoms Strength training completed today  Goals Unmet:  Not Applicable  Goals Comments: First day of exercise.  Time spent reviewing goals and evaluation  process staff will use to review goals for best outcomes at the end of program   [name] is Medical Director for [place] and [facility].

## 2015-09-14 DIAGNOSIS — I213 ST elevation (STEMI) myocardial infarction of unspecified site: Secondary | ICD-10-CM

## 2015-09-14 DIAGNOSIS — Z9861 Coronary angioplasty status: Secondary | ICD-10-CM

## 2015-09-14 NOTE — Progress Notes (Signed)
Daily Session Note  Patient Details  Name: George Jimenez MRN: [id number] Date of Birth: 30-Aug-1956 Referring Provider:  [name], MD  Encounter Date: 09/14/2015  Check In:     Session Check In - 09/14/15 0826    Check-In   Staff Present Heath Lark, RN, BSN, CCRP;Renee Dillard Essex, MS, ACSM CEP, Exercise Physiologist;Desiree Daise, BS, ACSM EP-C, Exercise Physiologist   ER physicians immediately available to respond to emergencies See telemetry face sheet for immediately available ER MD   Medication changes reported     No   Fall or balance concerns reported    No   Warm-up and Cool-down Performed on first and last piece of equipment   VAD Patient? No   Pain Assessment   Currently in Pain? No/denies         Goals Met:  Proper associated with RPD/PD & O2 Sat Exercise tolerated well No report of cardiac concerns or symptoms Strength training completed today  Goals Unmet:  Not Applicable  Goals Comments:   [name] is Medical Director for [place] and [facility].

## 2015-09-15 NOTE — Addendum Note (Signed)
Addended by: Evans LanceSTOVER, Kijana Cromie W on: 09/15/2015 07:36 AM   Modules accepted: Orders

## 2015-09-16 ENCOUNTER — Encounter: Payer: BLUE CROSS/BLUE SHIELD | Admitting: *Deleted

## 2015-09-16 DIAGNOSIS — I213 ST elevation (STEMI) myocardial infarction of unspecified site: Secondary | ICD-10-CM

## 2015-09-16 DIAGNOSIS — Z9861 Coronary angioplasty status: Secondary | ICD-10-CM

## 2015-09-16 NOTE — Progress Notes (Signed)
Daily Session Note  Patient Details  Name: George Jimenez MRN: [id number] Date of Birth: 1956/09/03 Referring Provider:  [name], MD  Encounter Date: 09/16/2015  Check In:     Session Check In - 09/16/15 1146    Check-In   Staff Present Heath Lark, RN, BSN, CCRP;Stacey Blanch Media, RRT, RCP, Respiratory Therapist;Renee Dillard Essex, MS, ACSM CEP, Exercise Physiologist   ER physicians immediately available to respond to emergencies See telemetry face sheet for immediately available ER MD   Medication changes reported     No   Fall or balance concerns reported    No   VAD Patient? No   Pain Assessment   Currently in Pain? No/denies         Goals Met:  Exercise tolerated well No report of cardiac concerns or symptoms  Goals Unmet:  Not Applicable  Goals Comments: [name] completed his aerobic work today and skipped resistive work because he had to leave early for a meeting at work.    [name] is Medical Director for [place] and [facility].

## 2015-09-19 ENCOUNTER — Encounter: Payer: BLUE CROSS/BLUE SHIELD | Admitting: *Deleted

## 2015-09-19 DIAGNOSIS — I213 ST elevation (STEMI) myocardial infarction of unspecified site: Secondary | ICD-10-CM

## 2015-09-19 DIAGNOSIS — Z9861 Coronary angioplasty status: Secondary | ICD-10-CM

## 2015-09-19 NOTE — Progress Notes (Signed)
Daily Session Note  Patient Details  Name: George Jimenez MRN: [id number] Date of Birth: 27-Jul-1956 Referring Provider:  [name], MD  Encounter Date: 09/19/2015  Check In:     Session Check In - 09/19/15 0818    Check-In   Staff Present Candiss Norse, MS, ACSM CEP, Exercise Physiologist;Janyla Biscoe Amedeo Plenty, BS, ACSM CEP, Exercise Physiologist;Susanne Bice, RN, BSN, [place]   ER physicians immediately available to respond to emergencies See telemetry face sheet for immediately available ER MD   Medication changes reported     No   Fall or balance concerns reported    No   Warm-up and Cool-down Performed on first and last piece of equipment   VAD Patient? No   Pain Assessment   Currently in Pain? No/denies   Multiple Pain Sites No         Goals Met:  Independence with exercise equipment Exercise tolerated well No report of cardiac concerns or symptoms Strength training completed today  Goals Unmet:  Not Applicable  Goals Comments: Patient completed exercise prescription and all exercise goals during rehab session. The exercise was tolerated well and the patient is progressing in the program.     [name] is Medical Director for [place] and [facility].

## 2015-09-21 DIAGNOSIS — I213 ST elevation (STEMI) myocardial infarction of unspecified site: Secondary | ICD-10-CM | POA: Diagnosis not present

## 2015-09-21 DIAGNOSIS — Z9861 Coronary angioplasty status: Secondary | ICD-10-CM

## 2015-09-21 NOTE — Progress Notes (Signed)
Daily Session Note  Patient Details  Name: Leotha Voeltz MRN: [id number] Date of Birth: 1955/11/27 Referring Provider:  [name], MD  Encounter Date: 09/21/2015  Check In:     Session Check In - 09/21/15 0908    Check-In   Staff Present Heath Lark, RN, BSN, CCRP;Renee Dillard Essex, MS, ACSM CEP, Exercise Physiologist;Kassandra Meriweather, BS, ACSM EP-C, Exercise Physiologist   ER physicians immediately available to respond to emergencies See telemetry face sheet for immediately available ER MD   Medication changes reported     No   Fall or balance concerns reported    No   Warm-up and Cool-down Performed on first and last piece of equipment   VAD Patient? No   Pain Assessment   Currently in Pain? No/denies         Goals Met:  Proper associated with RPD/PD & O2 Sat Exercise tolerated well No report of cardiac concerns or symptoms Strength training completed today  Goals Unmet:  Not Applicable  Goals Comments:    [name] is Medical Director for [place] and [facility].

## 2015-09-23 ENCOUNTER — Encounter: Payer: BLUE CROSS/BLUE SHIELD | Admitting: *Deleted

## 2015-09-23 DIAGNOSIS — I213 ST elevation (STEMI) myocardial infarction of unspecified site: Secondary | ICD-10-CM | POA: Diagnosis not present

## 2015-09-23 DIAGNOSIS — Z9861 Coronary angioplasty status: Secondary | ICD-10-CM

## 2015-09-23 NOTE — Progress Notes (Signed)
Daily Session Note  Patient Details  Name: Nevaan Bunton MRN: [id number] Date of Birth: 12/03/1955 Referring Provider:  [name], MD  Encounter Date: 09/23/2015  Check In:     Session Check In - 09/23/15 0903    Check-In   Staff Present Gerlene Burdock, RN, Drusilla Kanner, MS, ACSM CEP, Exercise Physiologist;Susanne Bice, RN, BSN, [facility]   ER physicians immediately available to respond to emergencies See telemetry face sheet for immediately available ER MD   Medication changes reported     No   Fall or balance concerns reported    No   Warm-up and Cool-down Performed on first and last piece of equipment   VAD Patient? No   Pain Assessment   Currently in Pain? No/denies         Goals Met:  Proper associated with RPD/PD & O2 Sat Exercise tolerated well  Goals Unmet:  Not Applicable  Goals Comments:    [name] is Medical Director for [place] and [facility].

## 2015-09-26 ENCOUNTER — Encounter: Payer: BLUE CROSS/BLUE SHIELD | Admitting: *Deleted

## 2015-09-26 DIAGNOSIS — I213 ST elevation (STEMI) myocardial infarction of unspecified site: Secondary | ICD-10-CM | POA: Diagnosis not present

## 2015-09-26 DIAGNOSIS — Z9861 Coronary angioplasty status: Secondary | ICD-10-CM

## 2015-09-26 NOTE — Progress Notes (Signed)
Daily Session Note  Patient Details  Name: George Jimenez MRN: [id number] Date of Birth: 1956-05-21 Referring Provider:  [name], MD  Encounter Date: 09/26/2015  Check In:     Session Check In - 09/26/15 0823    Check-In   Staff Present Candiss Norse, MS, ACSM CEP, Exercise Physiologist;Susanne Bice, RN, BSN, Laveda Norman, BS, ACSM CEP, Exercise Physiologist   ER physicians immediately available to respond to emergencies See telemetry face sheet for immediately available ER MD   Medication changes reported     No   Fall or balance concerns reported    No   Warm-up and Cool-down Performed on first and last piece of equipment   VAD Patient? No   Pain Assessment   Currently in Pain? No/denies   Multiple Pain Sites No         Goals Met:  Independence with exercise equipment Exercise tolerated well No report of cardiac concerns or symptoms Strength training completed today  Goals Unmet:  Not Applicable  Goals Comments: Patient completed exercise prescription and all exercise goals during rehab session. The exercise was tolerated well and the patient is progressing in the program.     [name] is Medical Director for [place] and [facility].

## 2015-09-28 DIAGNOSIS — I213 ST elevation (STEMI) myocardial infarction of unspecified site: Secondary | ICD-10-CM | POA: Diagnosis not present

## 2015-09-28 DIAGNOSIS — Z9861 Coronary angioplasty status: Secondary | ICD-10-CM

## 2015-09-28 NOTE — Progress Notes (Signed)
Daily Session Note  Patient Details  Name: Sohil Timko MRN: [id number] Date of Birth: Oct 03, 1955 Referring Provider:  [name], MD  Encounter Date: 09/28/2015  Check In:     Session Check In - 09/28/15 0818    Check-In   Staff Present Heath Lark, RN, BSN, CCRP;Mary Kellie Shropshire, RN;Amarien Carne, BS, ACSM EP-C, Exercise Physiologist   ER physicians immediately available to respond to emergencies See telemetry face sheet for immediately available ER MD   Medication changes reported     No   Fall or balance concerns reported    No   Warm-up and Cool-down Performed on first and last piece of equipment   VAD Patient? No   Pain Assessment   Currently in Pain? No/denies         Goals Met:  Proper associated with RPD/PD & O2 Sat Exercise tolerated well No report of cardiac concerns or symptoms Strength training completed today  Goals Unmet:  Not Applicable  Goals Comments:    [name] is Medical Director for [place] and [facility].

## 2015-09-29 NOTE — Progress Notes (Signed)
Cardiac Individual Treatment Plan  Patient Details  Name: George Jimenez MRN: [id number] Date of Birth: [birth year] Referring Provider:  [name], MD  Initial Encounter Date:    Visit Diagnosis: ST elevation myocardial infarction (STEMI), unspecified artery ([place])  S/P PTCA (percutaneous transluminal coronary angioplasty)  Patient's Home Medications on Admission:  Current outpatient prescriptions:  .  aspirin EC 81 MG tablet, Take 81 mg by mouth daily., Disp: , Rfl:  .  atorvastatin (LIPITOR) 80 MG tablet, Take 1 tablet (80 mg total) by mouth daily at 6 PM., Disp: 30 tablet, Rfl: 11 .  carvedilol (COREG) 3.125 MG tablet, Take 1 tablet (3.125 mg total) by mouth 2 (two) times daily with a meal., Disp: 30 tablet, Rfl: 11 .  losartan (COZAAR) 50 MG tablet, Take 50 mg by mouth daily., Disp: , Rfl: 12 .  Multiple Vitamin (MULTIVITAMIN WITH MINERALS) TABS tablet, Take 1 tablet by mouth daily. Reported on 09/06/2015, Disp: , Rfl:  .  nitroGLYCERIN (NITROSTAT) 0.4 MG SL tablet, Place 1 tablet (0.4 mg total) under the tongue every 5 (five) minutes as needed for chest pain., Disp: 25 tablet, Rfl: 12 .  ticagrelor (BRILINTA) 90 MG TABS tablet, Take 1 tablet (90 mg total) by mouth 2 (two) times daily., Disp: 60 tablet, Rfl: 6  Past Medical History: Past Medical History  Diagnosis Date  . HTN (hypertension)   . Diverticulitis   . Dyslipidemia     low HDL  . STEMI (ST elevation myocardial infarction) ([place]) 08/17/2015    DES LAD    Tobacco Use: History  Smoking status  . Never Smoker   Smokeless tobacco  . Not on file    Labs: Recent Review Flowsheet Data    Labs for ITP Cardiac and Pulmonary Rehab Latest Ref Rng 08/18/2015   Cholestrol 0 - 200 mg/dL 149   LDLCALC 0 - 99 mg/dL 94   HDL >40 mg/dL 29(L)   Trlycerides <150 mg/dL 130       Exercise Target Goals:    Exercise Program Goal: Individual exercise prescription set with THRR, safety & activity barriers. Participant  demonstrates ability to understand and report RPE using BORG scale, to self-measure pulse accurately, and to acknowledge the importance of the exercise prescription.  Exercise Prescription Goal: Starting with aerobic activity 30 plus minutes a day, 3 days per week for initial exercise prescription. Provide home exercise prescription and guidelines that participant acknowledges understanding prior to discharge.  Activity Barriers & Risk Stratification:     Activity Barriers & Risk Stratification - 09/06/15 0853    Activity Barriers & Risk Stratification   Activity Barriers None   Risk Stratification High      6 Minute Walk:     6 Minute Walk      09/06/15 1034       6 Minute Walk   Phase Initial     Distance 1743 feet     Walk Time 6 minutes     Resting HR 66 bpm     Resting BP 126/88 mmHg     Max Ex. HR 87 bpm     Max Ex. BP 148/88 mmHg     RPE 13     Symptoms No        Initial Exercise Prescription:     Initial Exercise Prescription - 09/06/15 1000    Date of Initial Exercise Prescription   Date 09/06/15   Treadmill   MPH 3   Grade 0   Minutes 10  Bike   Level 0.4   Minutes 15   Recumbant Bike   Level 4   RPM 45   Watts 30   Minutes 15   NuStep   Level 3   Watts 30   Minutes 15   Arm Ergometer   Level 1   Watts 8   Minutes 10   Arm/Foot Ergometer   Level 4   Watts 12   Minutes 10   Cybex   Level 3   RPM 50   Minutes 15   Recumbant Elliptical   Level 2   RPM 40   Watts 15   Minutes 15   Elliptical   Level 1   Speed 3   Minutes 1   REL-XR   Level 3   Watts 45   Minutes 15   T5 Nustep   Level 2   Watts 20   Minutes 15   Biostep-RELP   Level 3   Watts 20   Minutes 15   Prescription Details   Frequency (times per week) 3   Duration Progress to 30 minutes of continuous aerobic without signs/symptoms of physical distress   Intensity   THRR REST +  30   Ratings of Perceived Exertion 11-15   Progression Continue progressive  overload as per policy without signs/symptoms or physical distress.   Resistance Training   Training Prescription Yes   Weight 3   Reps 10-15      Exercise Prescription Changes:     Exercise Prescription Changes      09/09/15 1200 09/14/15 1200 09/19/15 0657 09/20/15 0700     Exercise Review   Progression  Yes Yes Yes    Response to Exercise   Blood Pressure (Admit)   114/78 mmHg     Blood Pressure (Exercise)   148/74 mmHg     Blood Pressure (Exit)   130/78 mmHg     Heart Rate (Admit)   75 bpm     Heart Rate (Exercise)   132 bpm     Heart Rate (Exit)   74 bpm     Symptoms None None None None    Comments First day of exercise! Patient was oriented to the gym and the equipment functions and settings. Procedures and policies of the gym were outlined and explained. The patient's individual exercise prescription and treatment plan were reviewed with them. All starting workloads were established based on the results of the functional testing  done at the initial intake visit. The plan for exercise progression was also introduced and progression will be customized based on the patient's performance and goals.  George Jimenez is an avid exerciser and can exercise for the entire class time. His progression will now involve increases in intensity. He lifts weights regularly and was cleared by his MD to lift more than 15 lbs.  George Jimenez is progressing his intervals and has started jogging on the TM. He progresses well on his own and is very independent in his exercise. He has a clear understanding of what he needs to do. He walks and lifts weights outside of class. A new target heart rate range for exercise was calculated based on the patient's ability to exercise with increased intensity. The range is 40-85% of HRR and encompasses moderate-vigorous exercise. The target heart rate range is equivalent to an RPE of 12-17.     Frequency  Add 3 additional days to program exercise sessions. Add 3 additional days to  program exercise sessions. Add 3 additional  days to program exercise sessions.    Duration Progress to 50 minutes of aerobic without signs/symptoms of physical distress Progress to 50 minutes of aerobic without signs/symptoms of physical distress Progress to 50 minutes of aerobic without signs/symptoms of physical distress Progress to 50 minutes of aerobic without signs/symptoms of physical distress    Intensity Rest + 30 Rest + 30 Rest + 30 Other (comment)  40-85% HRR ~112-154 bpm    Progression Continue progressive overload as per policy without signs/symptoms or physical distress. Continue progressive overload as per policy without signs/symptoms or physical distress. Continue progressive overload as per policy without signs/symptoms or physical distress. Continue progressive overload as per policy without signs/symptoms or physical distress.    Resistance Training   Training Prescription Yes Yes Yes Yes    Weight _0 Reps 10-15 10-15 10-15 10-15    Treadmill   MPH 3.6 4 4.5 4.5    Grade 0 0 0 0    Minutes 45 45 45 45       Discharge Exercise Prescription (Final Exercise Prescription Changes):     Exercise Prescription Changes - 09/20/15 0700    Exercise Review   Progression Yes   Response to Exercise   Symptoms None   Comments A new target heart rate range for exercise was calculated based on the patient's ability to exercise with increased intensity. The range is 40-85% of HRR and encompasses moderate-vigorous exercise. The target heart rate range is equivalent to an RPE of 12-17.    Frequency Add 3 additional days to program exercise sessions.   Duration Progress to 50 minutes of aerobic without signs/symptoms of physical distress   Intensity Other (comment)  40-85% HRR ~112-154 bpm   Progression Continue progressive overload as per policy without signs/symptoms or physical distress.   Resistance Training   Training Prescription Yes   Weight 10   Reps 10-15    Treadmill   MPH 4.5   Grade 0   Minutes 45      Nutrition:  Target Goals: Understanding of nutrition guidelines, daily intake of sodium <1584m, cholesterol <2032m calories 30% from fat and 7% or less from saturated fats, daily to have 5 or more servings of fruits and vegetables.  Biometrics:     Pre Biometrics - 09/06/15 1033    Pre Biometrics   Height 6' 1.25" (1.861 m)   Weight 187 lb 11.2 oz (85.14 kg)   Waist Circumference 35 inches   Hip Circumference 40 inches   Waist to Hip Ratio 0.88 %   BMI (Calculated) 24.6       Nutrition Therapy Plan and Nutrition Goals:     Nutrition Therapy & Goals - 09/19/15 1007    Personal Nutrition Goals   Personal Goal #1 Prefers not to meet with dietician. Eats a heart healthy diet. Was told by MD that genetics is the only factor that caused his heart attack. He is very conscious of what he eats.      Nutrition Discharge: Rate Your Plate Scores:     Rate Your Plate - 0176/19/5029326  Rate Your Plate Scores   Pre Score 76.66      Nutrition Goals Re-Evaluation:   Psychosocial: Target Goals: Acknowledge presence or absence of depression, maximize coping skills, provide positive support system. Participant is able to verbalize types and ability to use techniques and skills needed for reducing stress and depression.  Initial Review & Psychosocial Screening:  Initial Psych Review & Screening - 09/06/15 [place]? Yes   Barriers   Psychosocial barriers to participate in program There are no identifiable barriers or psychosocial needs.;The patient should benefit from training in stress management and relaxation.   Screening Interventions   Interventions Encouraged to exercise      Quality of Life Scores:     Quality of Life - 09/14/15 1247    Quality of Life Scores   Health/Function Pre 24.47 %   Socioeconomic Pre 26.86 %   Psych/Spiritual Pre 26.57 %   Family Pre 28.8 %    GLOBAL Pre 26.03 %      PHQ-9:     Recent Review Flowsheet Data    Depression screen [facility] 2/9 09/06/2015   Decreased Interest 0   Down, Depressed, Hopeless 0   PHQ - 2 Score 0   Altered sleeping 0   Tired, decreased energy 0   Change in appetite 0   Feeling bad or failure about yourself  0   Trouble concentrating 0   Moving slowly or fidgety/restless 0   Suicidal thoughts 0   PHQ-9 Score 0   Difficult doing work/chores Not difficult at all      Psychosocial Evaluation and Intervention:     Psychosocial Evaluation - 09/14/15 0950    Psychosocial Evaluation & Interventions   Interventions Stress management education;Encouraged to exercise with the program and follow exercise prescription;Relaxation education   Comments Counselor met with Mr. Kahrs today for initial psychosocial evaluation.  He is a 60 year old who had a heart attack in the gym on 12/14 and admits that heart disease runs in his family.  Mr. Steagall has a strong support system with a spouse of 28 years and adult children who live close by.  He is also a member of a [employer].  Mr. Detienne is otherwise in good health with sleeping and eating well.  He denies a history of depression or anxiety or any current symptoms.  He reports typically being in a positive mood most of the time and has recently addressed job stress that he was experiencing by being proactive in making changes in the workplace.  Mr. Codd has goals to increase his education on what he can do to improve his health.  One goal he identifed is to increase his cardio workouts in the gym versus weight lifiting.  Therapist will follow up with Mr. Rostron as needed.     Continued Psychosocial Services Needed Yes  Mr. Halley participated in the stress management training today and is encouraged to practice more intentionally the stress management techniques discussed.        Psychosocial Re-Evaluation:   Vocational Rehabilitation: Provide vocational rehab  assistance to qualifying candidates.   Vocational Rehab Evaluation & Intervention:     Vocational Rehab - 09/06/15 0854    Initial Vocational Rehab Evaluation & Intervention   Assessment shows need for Vocational Rehabilitation No      Education: Education Goals: Education classes will be provided on a weekly basis, covering required topics. Participant will state understanding/return demonstration of topics presented.  Learning Barriers/Preferences:     Learning Barriers/Preferences - 09/06/15 0854    Learning Barriers/Preferences   Learning Barriers None   Learning Preferences Written Material      Education Topics: General Nutrition Guidelines/Fats and Fiber: -Group instruction provided by verbal, written material, models and posters to present the general guidelines for heart healthy  nutrition. Gives an explanation and review of dietary fats and fiber.   Controlling Sodium/Reading Food Labels: -Group verbal and written material supporting the discussion of sodium use in heart healthy nutrition. Review and explanation with models, verbal and written materials for utilization of the food label.   Exercise Physiology & Risk Factors: - Group verbal and written instruction with models to review the exercise physiology of the cardiovascular system and associated critical values. Details cardiovascular disease risk factors and the goals associated with each risk factor.   Aerobic Exercise & Resistance Training: - Gives group verbal and written discussion on the health impact of inactivity. On the components of aerobic and resistive training programs and the benefits of this training and how to safely progress through these programs.          Cardiac Rehab from 09/28/2015 in [facility] Cardiac and Pulmonary Rehab   Date  09/19/15   Educator  RM   Instruction Review Code  2- meets goals/outcomes      Flexibility, Balance, General Exercise Guidelines: - Provides group verbal and  written instruction on the benefits of flexibility and balance training programs. Provides general exercise guidelines with specific guidelines to those with heart or lung disease. Demonstration and skill practice provided.      Cardiac Rehab from 09/28/2015 in [facility] Cardiac and Pulmonary Rehab   Date  09/19/15   Educator  RM   Instruction Review Code  2- meets goals/outcomes      Stress Management: - Provides group verbal and written instruction about the health risks of elevated stress, cause of high stress, and healthy ways to reduce stress.      Cardiac Rehab from 09/28/2015 in [facility] Cardiac and Pulmonary Rehab   Date  09/14/15   Educator  [facility]   Instruction Review Code  2- meets goals/outcomes      Depression: - Provides group verbal and written instruction on the correlation between heart/lung disease and depressed mood, treatment options, and the stigmas associated with seeking treatment.   Anatomy & Physiology of the Heart: - Group verbal and written instruction and models provide basic cardiac anatomy and physiology, with the coronary electrical and arterial systems. Review of: AMI, Angina, Valve disease, Heart Failure, Cardiac Arrhythmia, Pacemakers, and the ICD.      Cardiac Rehab from 09/28/2015 in [facility] Cardiac and Pulmonary Rehab   Date  09/26/15   Educator  SB   Instruction Review Code  2- meets goals/outcomes      Cardiac Procedures: - Group verbal and written instruction and models to describe the testing methods done to diagnose heart disease. Reviews the outcomes of the test results. Describes the treatment choices: Medical Management, Angioplasty, or Coronary Bypass Surgery.   Cardiac Medications: - Group verbal and written instruction to review commonly prescribed medications for heart disease. Reviews the medication, class of the drug, and side effects. Includes the steps to properly store meds and maintain the prescription regimen.   Go Sex-Intimacy & Heart  Disease, Get SMART - Goal Setting: - Group verbal and written instruction through game format to discuss heart disease and the return to sexual intimacy. Provides group verbal and written material to discuss and apply goal setting through the application of the S.M.A.R.T. Method.   Other Matters of the Heart: - Provides group verbal, written materials and models to describe Heart Failure, Angina, Valve Disease, and Diabetes in the realm of heart disease. Includes description of the disease process and treatment options available to the cardiac patient.  Exercise & Equipment Safety: - Individual verbal instruction and demonstration of equipment use and safety with use of the equipment.      Cardiac Rehab from 09/28/2015 in [facility] Cardiac and Pulmonary Rehab   Date  09/06/15   Educator  SB   Instruction Review Code  2- meets goals/outcomes      Infection Prevention: - Provides verbal and written material to individual with discussion of infection control including proper hand washing and proper equipment cleaning during exercise session.      Cardiac Rehab from 09/28/2015 in [facility] Cardiac and Pulmonary Rehab   Date  09/06/15   Educator  SB   Instruction Review Code  2- meets goals/outcomes      Falls Prevention: - Provides verbal and written material to individual with discussion of falls prevention and safety.      Cardiac Rehab from 09/28/2015 in [facility] Cardiac and Pulmonary Rehab   Date  09/06/15   Educator  Sb   Instruction Review Code  2- meets goals/outcomes      Diabetes: - Individual verbal and written instruction to review signs/symptoms of diabetes, desired ranges of glucose level fasting, after meals and with exercise. Advice that pre and post exercise glucose checks will be done for 3 sessions at entry of program.    Knowledge Questionnaire Score:     Knowledge Questionnaire Score - 09/14/15 1250    Knowledge Questionnaire Score   Pre Score 24      Personal Goals  and Risk Factors at Admission:     Personal Goals and Risk Factors at Admission - 09/06/15 0854    Personal Goals and Risk Factors on Admission   Increase Aerobic Exercise and Physical Activity Yes   Intervention While in program, learn and follow the exercise prescription taught. Start at a low level workload and increase workload after able to maintain previous level for 30 minutes. Increase time before increasing intensity.  Was working out every day, working with  aerobic weight training.  HIgh intensity/low weights.  prior to heart attack.  Would like to return to this and more walking; at least 30 minutes a day.    Take Less Medication Yes   Intervention Learn your risk factors and begin the lifestyle modifications for risk factor control during your time in the program.   Understand more about Heart/Pulmonary Disease. Yes   Intervention While in program utilize professionals for any questions, and attend the education sessions. Great websites to use are www.americanheart.org or www.lung.org for reliable information.   Hypertension Yes   Goal Participant will see blood pressure controlled within the values of 140/28m/Hg or within value directed by their physician.   Intervention Provide nutrition & aerobic exercise along with prescribed medications to achieve BP 140/90 or less.   Lipids Yes   Goal Cholesterol controlled with medications as prescribed, with individualized exercise RX and with personalized nutrition plan. Value goals: LDL < 742m HDL > 4069mParticipant states understanding of desired cholesterol values and following prescriptions.   Intervention Provide nutrition & aerobic exercise along with prescribed medications to achieve LDL <10m27mDL >40mg58mStress Yes   Goal To meet with psychosocial counselor for stress and relaxation information and guidance. To state understanding of performing relaxation techniques and or identifying personal stressors.   Intervention Provide  education on types of stress, identifiying stressors, and ways to cope with stress. Provide demonstration and active practice of relaxation techniques.  Stress at work. Has been lowered since returned to work.  JOb duties have changed to alleviate the major work stress.  This has made a huge difference for Bryar.       Personal Goals and Risk Factors Review:      Goals and Risk Factor Review      09/14/15 1207 09/20/15 0659         Increase Aerobic Exercise and Physical Activity   Goals Progress/Improvement seen  Yes Yes      Comments Hussien works out regularly at the [employer] and has a lot of experience with exercise. He has been cleared by his MD to lift more than 15 lbs. He exercises well in class and often states that it is very easy for him. We will follow up with him in the next few weeks with exercise challenges.  We have progressed Raffaele to help make the exercise more challenging. He is now doing jogging intervals on the TM and feels good about it. He has increased his dumbbell weight to 10lbs and we have given him extra challenges he can do during the weight training time. He continues to exercise on his own 3 days in addittion to class. We will followup with him on how he is maintaining this next month.          Personal Goals Discharge (Final Personal Goals and Risk Factors Review):      Goals and Risk Factor Review - 09/20/15 0659    Increase Aerobic Exercise and Physical Activity   Goals Progress/Improvement seen  Yes   Comments We have progressed Braeden to help make the exercise more challenging. He is now doing jogging intervals on the TM and feels good about it. He has increased his dumbbell weight to 10lbs and we have given him extra challenges he can do during the weight training time. He continues to exercise on his own 3 days in addittion to class. We will followup with him on how he is maintaining this next month.       ITP Comments:     ITP Comments       09/06/15 1223 09/28/15 0920 09/29/15 1200       ITP Comments Initial ITP Continue with ITP Has chosen to meet with our dietician this week.   Ready for 30 day review. Continue with ITP.        Comments:

## 2015-09-30 ENCOUNTER — Encounter: Payer: BLUE CROSS/BLUE SHIELD | Admitting: *Deleted

## 2015-09-30 DIAGNOSIS — I213 ST elevation (STEMI) myocardial infarction of unspecified site: Secondary | ICD-10-CM

## 2015-09-30 DIAGNOSIS — Z9861 Coronary angioplasty status: Secondary | ICD-10-CM

## 2015-09-30 NOTE — Progress Notes (Signed)
Daily Session Note  Patient Details  Name: George Jimenez MRN: [id number] Date of Birth: 13-Aug-1956 Referring Provider:  [name], MD  Encounter Date: 09/30/2015  Check In:     Session Check In - 09/30/15 0907    Check-In   Staff Present Gerlene Burdock, RN, BSN;Susanne Bice, RN, BSN, CCRP;Renee Dillard [place], MS, ACSM CEP, Exercise Physiologist   ER physicians immediately available to respond to emergencies See telemetry face sheet for immediately available ER MD   Medication changes reported     No   Fall or balance concerns reported    No   VAD Patient? No   Pain Assessment   Currently in Pain? No/denies         Goals Met:  Proper associated with RPD/PD & O2 Sat Exercise tolerated well  Goals Unmet:  Not Applicable  Goals Comments: Runs 6.33mh on treadmill.    Dr. MEmily Filbertis Medical Director for [facility].

## 2015-10-03 ENCOUNTER — Encounter: Payer: BLUE CROSS/BLUE SHIELD | Admitting: *Deleted

## 2015-10-03 DIAGNOSIS — I213 ST elevation (STEMI) myocardial infarction of unspecified site: Secondary | ICD-10-CM

## 2015-10-03 DIAGNOSIS — Z9861 Coronary angioplasty status: Secondary | ICD-10-CM

## 2015-10-03 NOTE — Progress Notes (Signed)
Daily Session Note  Patient Details  Name: George Jimenez MRN: [id number] Date of Birth: 29-Aug-1956 Referring Provider:  [name], MD  Encounter Date: 10/03/2015  Check In:     Session Check In - 10/03/15 0840    Check-In   Staff Present Candiss Norse, MS, ACSM CEP, Exercise Physiologist;Susanne Bice, RN, BSN, Laveda Norman, BS, ACSM CEP, Exercise Physiologist   ER physicians immediately available to respond to emergencies See telemetry face sheet for immediately available ER MD   Medication changes reported     No   Fall or balance concerns reported    No   Warm-up and Cool-down Performed on first and last piece of equipment   VAD Patient? No   Pain Assessment   Currently in Pain? No/denies   Multiple Pain Sites No         Goals Met:  Independence with exercise equipment Exercise tolerated well No report of cardiac concerns or symptoms Strength training completed today  Goals Unmet:  Not Applicable  Goals Comments: Patient completed exercise prescription and all exercise goals during rehab session. The exercise was tolerated well and the patient is progressing in the program.     [name] is Medical Director for [facility].

## 2015-10-05 ENCOUNTER — Encounter: Payer: BLUE CROSS/BLUE SHIELD | Attending: Cardiovascular Disease

## 2015-10-05 DIAGNOSIS — I213 ST elevation (STEMI) myocardial infarction of unspecified site: Secondary | ICD-10-CM | POA: Diagnosis not present

## 2015-10-05 DIAGNOSIS — Z9861 Coronary angioplasty status: Secondary | ICD-10-CM | POA: Diagnosis present

## 2015-10-05 NOTE — Addendum Note (Signed)
Addended by: Rudy Jew on: 10/05/2015 07:20 AM   Modules accepted: Orders

## 2015-10-05 NOTE — Progress Notes (Signed)
Daily Session Note  Patient Details  Name: Ercole Georg MRN: [id number] Date of Birth: September 19, 1955 Referring Provider:  [name], MD  Encounter Date: 10/05/2015  Check In:     Session Check In - 10/05/15 0859    Check-In   Staff Present Heath Lark, RN, BSN, CCRP;Renee Dillard Essex, MS, ACSM CEP, Exercise Physiologist;Tri Chittick, BS, ACSM EP-C, Exercise Physiologist   ER physicians immediately available to respond to emergencies See telemetry face sheet for immediately available ER MD   Medication changes reported     No   Fall or balance concerns reported    No   Warm-up and Cool-down Performed on first and last piece of equipment   VAD Patient? No   Pain Assessment   Currently in Pain? No/denies         Goals Met:  Proper associated with RPD/PD & O2 Sat Exercise tolerated well No report of cardiac concerns or symptoms Strength training completed today  Goals Unmet:  Not Applicable  Goals Comments:   [name] is Medical Director for [facility] and [facility].

## 2015-10-07 ENCOUNTER — Encounter: Payer: BLUE CROSS/BLUE SHIELD | Admitting: *Deleted

## 2015-10-07 DIAGNOSIS — Z9861 Coronary angioplasty status: Secondary | ICD-10-CM

## 2015-10-07 DIAGNOSIS — I213 ST elevation (STEMI) myocardial infarction of unspecified site: Secondary | ICD-10-CM | POA: Diagnosis not present

## 2015-10-07 NOTE — Addendum Note (Signed)
Addended by: Neta Ehlers on: 10/07/2015 02:18 PM   Modules accepted: Orders

## 2015-10-07 NOTE — Progress Notes (Signed)
Daily Session Note  Patient Details  Name: Kaion Corsi MRN: [id number] Date of Birth: 02/10/1956 Referring Provider:  Kelly, Thomas A, MD  Encounter Date: 10/07/2015  Check In:     Session Check In - 10/07/15 0913    Check-In   Staff Present Carroll Enterkin, RN, BSN;Renee MacMillan, MS, ACSM CEP, Exercise Physiologist   ER physicians immediately available to respond to emergencies See telemetry face sheet for immediately available ER MD   Medication changes reported     No   Fall or balance concerns reported    No   Warm-up and Cool-down Performed on first and last piece of equipment   VAD Patient? No   Pain Assessment   Currently in Pain? No/denies         Goals Met:  Proper associated with RPD/PD & O2 Sat Exercise tolerated well  Goals Unmet:  Not Applicable  Goals Comments:    Dr. Mark Miller is Medical Director for [facility] and [facility]. 

## 2015-10-10 ENCOUNTER — Encounter: Payer: BLUE CROSS/BLUE SHIELD | Admitting: *Deleted

## 2015-10-10 DIAGNOSIS — I213 ST elevation (STEMI) myocardial infarction of unspecified site: Secondary | ICD-10-CM | POA: Diagnosis not present

## 2015-10-10 DIAGNOSIS — Z9861 Coronary angioplasty status: Secondary | ICD-10-CM

## 2015-10-10 NOTE — Progress Notes (Signed)
Daily Session Note  Patient Details  Name: George Jimenez MRN: [id number] Date of Birth: 02/04/1956 Referring Provider:  Miller, Mark F, MD  Encounter Date: 10/10/2015  Check In:     Session Check In - 10/10/15 0813    Check-In   Staff Present Susanne Bice, RN, BSN, CCRP;Renee MacMillan, MS, ACSM CEP, Exercise Physiologist;Kelly Hayes, BS, ACSM CEP, Exercise Physiologist   Supervising physician immediately available to respond to emergencies See telemetry face sheet for immediately available ER MD   Medication changes reported     No   Fall or balance concerns reported    No   Warm-up and Cool-down Performed on first and last piece of equipment   Resistance Training Performed No   VAD Patient? No   Pain Assessment   Multiple Pain Sites No         Goals Met:  Independence with exercise equipment Exercise tolerated well No report of cardiac concerns or symptoms Strength training completed today  Goals Unmet:  Not Applicable  Goals Comments: Patient completed exercise prescription and all exercise goals during rehab session. The exercise was tolerated well and the patient is progressing in the program.    Dr. Mark Miller is Medical Director for [facility] and [facility]. 

## 2015-10-12 ENCOUNTER — Encounter: Payer: BLUE CROSS/BLUE SHIELD | Admitting: *Deleted

## 2015-10-12 DIAGNOSIS — I213 ST elevation (STEMI) myocardial infarction of unspecified site: Secondary | ICD-10-CM | POA: Diagnosis not present

## 2015-10-12 DIAGNOSIS — Z9861 Coronary angioplasty status: Secondary | ICD-10-CM

## 2015-10-12 NOTE — Progress Notes (Signed)
Daily Session Note  Patient Details  Name: George Jimenez MRN: [id number] Date of Birth: 06-05-1956 Referring Provider:  Rusty Aus, MD  Encounter Date: 10/12/2015  Check In:     Session Check In - 10/12/15 0842    Check-In   Location [facility]   Staff Present Candiss Norse, MS, ACSM CEP, Exercise Physiologist;Carroll Enterkin, RN, BSN;Susanne Bice, RN, BSN, CCRP   Supervising physician immediately available to respond to emergencies See telemetry face sheet for immediately available ER MD   Medication changes reported     No   Fall or balance concerns reported    No   Warm-up and Cool-down Performed on first and last piece of equipment   Resistance Training Performed Yes   VAD Patient? No   Pain Assessment   Currently in Pain? No/denies   Multiple Pain Sites No           Exercise Prescription Changes - 10/12/15 0800    Exercise Review   Progression Yes   Response to Exercise   Symptoms None   Comments Reviewed individualized exercise prescription and made increases per departmental policy. Exercise increases were discussed with the patient and they were able to perform the new work loads without issue (no signs or symptoms).    Frequency Add 3 additional days to program exercise sessions.  He is maintaining 3d/wk in addition to HT   Duration Progress to 50 minutes of aerobic without signs/symptoms of physical distress   Intensity Other (comment)  40-85% HRR ~112-154 bpm   Progression Continue progressive overload as per policy without signs/symptoms or physical distress.   Resistance Training   Training Prescription Yes   Weight 10   Reps 10-15   Interval Training   Interval Training Yes   Equipment Treadmill   Comments 6-36mh   Treadmill   MPH 6.3   Grade 0   Minutes 45      Goals Met:  Independence with exercise equipment Exercise tolerated well Personal goals reviewed No report of cardiac concerns or symptoms Strength training  completed today  Goals Unmet:  Not Applicable  Goals Comments: Patient completed exercise prescription and all exercise goals during rehab session. The exercise was tolerated well and the patient is progressing in the program.    Dr. MEmily Filbertis Medical Director for [facility].

## 2015-10-14 ENCOUNTER — Encounter: Payer: BLUE CROSS/BLUE SHIELD | Admitting: *Deleted

## 2015-10-14 DIAGNOSIS — I213 ST elevation (STEMI) myocardial infarction of unspecified site: Secondary | ICD-10-CM | POA: Diagnosis not present

## 2015-10-14 DIAGNOSIS — Z9861 Coronary angioplasty status: Secondary | ICD-10-CM

## 2015-10-14 NOTE — Progress Notes (Signed)
Daily Session Note  Patient Details  Name: George Jimenez MRN: [id number] Date of Birth: Nov 01, 1955 Referring Provider:  [name], MD  Encounter Date: 10/14/2015  Check In:     Session Check In - 10/14/15 0902    Check-In   Location [facility]   Staff Present Gerlene Burdock, RN, Drusilla Kanner, MS, ACSM CEP, Exercise Physiologist;Susanne Bice, RN, BSN, CCRP   Supervising physician immediately available to respond to emergencies See telemetry face sheet for immediately available ER MD   Medication changes reported     No   Fall or balance concerns reported    No   Warm-up and Cool-down Performed on first and last piece of equipment   Resistance Training Performed Yes   VAD Patient? No   Pain Assessment   Currently in Pain? No/denies   Multiple Pain Sites No           Exercise Prescription Changes - 10/14/15 0900    Exercise Review   Progression Yes   Response to Exercise   Symptoms None   Comments Increased TM speed due to Jovani being able to run continuously for 40 minutes easily.    Frequency Add 3 additional days to program exercise sessions.  He is maintaining 3d/wk in addition to HT   Duration Progress to 50 minutes of aerobic without signs/symptoms of physical distress   Intensity Other (comment)  40-85% HRR ~112-154 bpm   Progression Continue progressive overload as per policy without signs/symptoms or physical distress.   Resistance Training   Training Prescription Yes   Weight 10   Reps 10-15   Interval Training   Interval Training Yes   Equipment Treadmill   Comments 6-58mh   Treadmill   MPH 6.5   Grade 0   Minutes 45      Goals Met:  Independence with exercise equipment Exercise tolerated well Personal goals reviewed No report of cardiac concerns or symptoms Strength training completed today  Goals Unmet:  Not Applicable  Goals Comments: Patient completed exercise prescription and all exercise goals during  rehab session. The exercise was tolerated well and the patient is progressing in the program.    Dr. MEmily Filbertis Medical Director for [facility].

## 2015-10-17 ENCOUNTER — Encounter: Payer: BLUE CROSS/BLUE SHIELD | Admitting: *Deleted

## 2015-10-17 DIAGNOSIS — I213 ST elevation (STEMI) myocardial infarction of unspecified site: Secondary | ICD-10-CM | POA: Diagnosis not present

## 2015-10-17 DIAGNOSIS — Z9861 Coronary angioplasty status: Secondary | ICD-10-CM

## 2015-10-17 NOTE — Progress Notes (Signed)
Daily Session Note  Patient Details  Name: George Jimenez MRN: [id number] Date of Birth: 1955/10/17 Referring Provider:  [name], MD  Encounter Date: 10/17/2015  Check In:     Session Check In - 10/17/15 0833    Check-In   Location [facility]   Staff Present Earlean Shawl, BS, ACSM CEP, Exercise Physiologist;Susanne Bice, RN, BSN, CCRP;Renee Dillard Essex, MS, ACSM CEP, Exercise Physiologist   Supervising physician immediately available to respond to emergencies See telemetry face sheet for immediately available ER MD   Medication changes reported     No   Fall or balance concerns reported    No   Warm-up and Cool-down Performed on first and last piece of equipment   Resistance Training Performed Yes   VAD Patient? No   Pain Assessment   Currently in Pain? No/denies   Multiple Pain Sites No         Goals Met:  Independence with exercise equipment Exercise tolerated well No report of cardiac concerns or symptoms Strength training completed today  Goals Unmet:  Not Applicable  Goals Comments: Patient completed exercise prescription and all exercise goals during rehab session. The exercise was tolerated well and the patient is progressing in the program.     [name] is Medical Director for [place] and [facility].

## 2015-10-19 ENCOUNTER — Encounter: Payer: BLUE CROSS/BLUE SHIELD | Admitting: *Deleted

## 2015-10-19 DIAGNOSIS — I213 ST elevation (STEMI) myocardial infarction of unspecified site: Secondary | ICD-10-CM | POA: Diagnosis not present

## 2015-10-19 DIAGNOSIS — Z9861 Coronary angioplasty status: Secondary | ICD-10-CM

## 2015-10-19 NOTE — Progress Notes (Signed)
Daily Session Note  Patient Details  Name: Kelon Easom MRN: [id number] Date of Birth: Dec 21, 1955 Referring Provider:  [name], MD  Encounter Date: 10/19/2015  Check In:     Session Check In - 10/19/15 0901    Check-In   Location [facility]   Staff Present Candiss Norse, MS, ACSM CEP, Exercise Physiologist;Susanne Bice, RN, BSN, CCRP;Laureen Owens Shark, BS, RRT, Respiratory Therapist   Supervising physician immediately available to respond to emergencies See telemetry face sheet for immediately available ER MD   Medication changes reported     No   Fall or balance concerns reported    No   Warm-up and Cool-down Performed on first and last piece of equipment   Resistance Training Performed Yes   VAD Patient? No   Pain Assessment   Currently in Pain? No/denies   Multiple Pain Sites No         Goals Met:  Independence with exercise equipment Exercise tolerated well No report of cardiac concerns or symptoms Strength training completed today  Goals Unmet:  Not Applicable  Goals Comments: Patient completed exercise prescription and all exercise goals during rehab session. The exercise was tolerated well and the patient is progressing in the program.    [name] is Medical Director for [place] and [facility].

## 2015-10-21 ENCOUNTER — Encounter: Payer: BLUE CROSS/BLUE SHIELD | Admitting: *Deleted

## 2015-10-21 DIAGNOSIS — Z9861 Coronary angioplasty status: Secondary | ICD-10-CM

## 2015-10-21 DIAGNOSIS — I213 ST elevation (STEMI) myocardial infarction of unspecified site: Secondary | ICD-10-CM | POA: Diagnosis not present

## 2015-10-21 NOTE — Progress Notes (Signed)
Daily Session Note  Patient Details  Name: George Jimenez MRN: [id number] Date of Birth: 05/29/1956 Referring Provider:  [name], MD  Encounter Date: 10/21/2015  Check In:     Session Check In - 10/21/15 0833    Check-In   Location [facility]   Staff Present Heath Lark, RN, BSN, CCRP;Renee Dillard Essex, MS, ACSM CEP, Exercise Physiologist;Aemon Koeller, RN, BSN   Supervising physician immediately available to respond to emergencies See telemetry face sheet for immediately available ER MD   Medication changes reported     No   Fall or balance concerns reported    No   Warm-up and Cool-down Performed on first and last piece of equipment   Resistance Training Performed Yes   VAD Patient? No   Pain Assessment   Currently in Pain? No/denies         Goals Met:  Proper associated with RPD/PD & O2 Sat Exercise tolerated well  Goals Unmet:  Not Applicable  Goals Comments:    [name] is Medical Director for [place] [facility].

## 2015-10-24 ENCOUNTER — Encounter: Payer: BLUE CROSS/BLUE SHIELD | Admitting: *Deleted

## 2015-10-24 DIAGNOSIS — I213 ST elevation (STEMI) myocardial infarction of unspecified site: Secondary | ICD-10-CM

## 2015-10-24 DIAGNOSIS — Z9861 Coronary angioplasty status: Secondary | ICD-10-CM

## 2015-10-24 NOTE — Progress Notes (Signed)
Daily Session Note  Patient Details  Name: Kodiak Rollyson MRN: [id number] Date of Birth: 22-Jul-1956 Referring Provider:  [name], MD  Encounter Date: 10/24/2015  Check In:     Session Check In - 10/24/15 0819    Check-In   Location [facility]   Staff Present Candiss Norse, MS, ACSM CEP, Exercise Physiologist;Susanne Bice, RN, BSN, Laveda Norman, BS, ACSM CEP, Exercise Physiologist   Supervising physician immediately available to respond to emergencies See telemetry face sheet for immediately available ER MD   Medication changes reported     No   Fall or balance concerns reported    No   Warm-up and Cool-down Performed on first and last piece of equipment   Resistance Training Performed Yes   VAD Patient? No   Pain Assessment   Currently in Pain? No/denies   Multiple Pain Sites No         Goals Met:  Independence with exercise equipment Exercise tolerated well No report of cardiac concerns or symptoms Strength training completed today  Goals Unmet:  Not Applicable  Goals Comments: Patient completed exercise prescription and all exercise goals during rehab session. The exercise was tolerated well and the patient is progressing in the program.     [name] is Medical Director for [place] and [facility].

## 2015-10-25 NOTE — Progress Notes (Signed)
Cardiac Individual Treatment Plan  Patient Details  Name: George Jimenez MRN: [id number] Date of Birth: 06-18-1956 Referring Provider:  [name], MD  Initial Encounter Date:    Visit Diagnosis: S/P PTCA (percutaneous transluminal coronary angioplasty)  ST elevation myocardial infarction (STEMI), unspecified artery ([place])  Patient's Home Medications on Admission:  Current outpatient prescriptions:  .  aspirin EC 81 MG tablet, Take 81 mg by mouth daily., Disp: , Rfl:  .  atorvastatin (LIPITOR) 80 MG tablet, Take 1 tablet (80 mg total) by mouth daily at 6 PM., Disp: 30 tablet, Rfl: 11 .  carvedilol (COREG) 3.125 MG tablet, Take 1 tablet (3.125 mg total) by mouth 2 (two) times daily with a meal., Disp: 30 tablet, Rfl: 11 .  losartan (COZAAR) 50 MG tablet, Take 50 mg by mouth daily., Disp: , Rfl: 12 .  Multiple Vitamin (MULTIVITAMIN WITH MINERALS) TABS tablet, Take 1 tablet by mouth daily. Reported on 09/06/2015, Disp: , Rfl:  .  nitroGLYCERIN (NITROSTAT) 0.4 MG SL tablet, Place 1 tablet (0.4 mg total) under the tongue every 5 (five) minutes as needed for chest pain., Disp: 25 tablet, Rfl: 12 .  ticagrelor (BRILINTA) 90 MG TABS tablet, Take 1 tablet (90 mg total) by mouth 2 (two) times daily., Disp: 60 tablet, Rfl: 6  Past Medical History: Past Medical History  Diagnosis Date  . HTN (hypertension)   . Diverticulitis   . Dyslipidemia     low HDL  . STEMI (ST elevation myocardial infarction) ([place]) 08/17/2015    DES LAD    Tobacco Use: History  Smoking status  . Never Smoker   Smokeless tobacco  . Not on file    Labs: Recent Review Flowsheet Data    Labs for ITP Cardiac and Pulmonary Rehab Latest Ref Rng 08/18/2015   Cholestrol 0 - 200 mg/dL 149   LDLCALC 0 - 99 mg/dL 94   HDL >40 mg/dL 29(L)   Trlycerides <150 mg/dL 130       Exercise Target Goals:    Exercise Program Goal: Individual exercise prescription set with THRR, safety & activity barriers. Participant  demonstrates ability to understand and report RPE using BORG scale, to self-measure pulse accurately, and to acknowledge the importance of the exercise prescription.  Exercise Prescription Goal: Starting with aerobic activity 30 plus minutes a day, 3 days per week for initial exercise prescription. Provide home exercise prescription and guidelines that participant acknowledges understanding prior to discharge.  Activity Barriers & Risk Stratification:     Activity Barriers & Cardiac Risk Stratification - 09/06/15 0853    Activity Barriers & Cardiac Risk Stratification   Activity Barriers None   Cardiac Risk Stratification High      6 Minute Walk:     6 Minute Walk      09/06/15 1034       6 Minute Walk   Phase Initial     Distance 1743 feet     Walk Time 6 minutes     RPE 13     Symptoms No     Resting HR 66 bpm     Resting BP 126/88 mmHg     Max Ex. HR 87 bpm     Max Ex. BP 148/88 mmHg        Initial Exercise Prescription:     Initial Exercise Prescription - 09/06/15 1000    Date of Initial Exercise Prescription   Date 09/06/15   Treadmill   MPH 3   Grade 0  Minutes 10   Bike   Level 0.4   Minutes 15   Recumbant Bike   Level 4   RPM 45   Watts 30   Minutes 15   NuStep   Level 3   Watts 30   Minutes 15   Arm Ergometer   Level 1   Watts 8   Minutes 10   Arm/Foot Ergometer   Level 4   Watts 12   Minutes 10   Cybex   Level 3   RPM 50   Minutes 15   Recumbant Elliptical   Level 2   RPM 40   Watts 15   Minutes 15   Elliptical   Level 1   Speed 3   Minutes 1   REL-XR   Level 3   Watts 45   Minutes 15   T5 Nustep   Level 2   Watts 20   Minutes 15   Biostep-RELP   Level 3   Watts 20   Minutes 15   Prescription Details   Frequency (times per week) 3   Duration Progress to 30 minutes of continuous aerobic without signs/symptoms of physical distress   Intensity   THRR REST +  30   Ratings of Perceived Exertion 11-15   Progression  Continue progressive overload as per policy without signs/symptoms or physical distress.   Resistance Training   Training Prescription Yes   Weight 3   Reps 10-15      Exercise Prescription Changes:     Exercise Prescription Changes      09/09/15 1200 09/14/15 1200 09/19/15 0657 09/20/15 0700 10/12/15 0800   Exercise Review   Progression  Yes Yes Yes Yes   Response to Exercise   Blood Pressure (Admit)   114/78 mmHg     Blood Pressure (Exercise)   148/74 mmHg     Blood Pressure (Exit)   130/78 mmHg     Heart Rate (Admit)   75 bpm     Heart Rate (Exercise)   132 bpm     Heart Rate (Exit)   74 bpm     Symptoms None None None None None   Comments First day of exercise! Patient was oriented to the gym and the equipment functions and settings. Procedures and policies of the gym were outlined and explained. The patient's individual exercise prescription and treatment plan were reviewed with them. All starting workloads were established based on the results of the functional testing  done at the initial intake visit. The plan for exercise progression was also introduced and progression will be customized based on the patient's performance and goals.  George Jimenez is an avid exerciser and can exercise for the entire class time. His progression will now involve increases in intensity. He lifts weights regularly and was cleared by his MD to lift more than 15 lbs.  George Jimenez is progressing his intervals and has started jogging on the TM. He progresses well on his own and is very independent in his exercise. He has a clear understanding of what he needs to do. He walks and lifts weights outside of class. A new target heart rate range for exercise was calculated based on the patient's ability to exercise with increased intensity. The range is 40-85% of HRR and encompasses moderate-vigorous exercise. The target heart rate range is equivalent to an RPE of 12-17.  Reviewed individualized exercise prescription and  made increases per departmental policy. Exercise increases were discussed with the patient and they were  able to perform the new work loads without issue (no signs or symptoms).    Frequency  Add 3 additional days to program exercise sessions. Add 3 additional days to program exercise sessions. Add 3 additional days to program exercise sessions. Add 3 additional days to program exercise sessions.  He is maintaining 3d/wk in addition to HT   Duration Progress to 50 minutes of aerobic without signs/symptoms of physical distress Progress to 50 minutes of aerobic without signs/symptoms of physical distress Progress to 50 minutes of aerobic without signs/symptoms of physical distress Progress to 50 minutes of aerobic without signs/symptoms of physical distress Progress to 50 minutes of aerobic without signs/symptoms of physical distress   Intensity Rest + 30 Rest + 30 Rest + 30 Other (comment)  40-85% HRR ~112-154 bpm Other (comment)  40-85% HRR ~112-154 bpm   Progression Continue progressive overload as per policy without signs/symptoms or physical distress. Continue progressive overload as per policy without signs/symptoms or physical distress. Continue progressive overload as per policy without signs/symptoms or physical distress. Continue progressive overload as per policy without signs/symptoms or physical distress. Continue progressive overload as per policy without signs/symptoms or physical distress.   Resistance Training   Training Prescription Yes Yes Yes Yes Yes   Weight 5 10 10 10 10    Reps 10-15 10-15 10-15 10-15 10-15   Interval Training   Interval Training     Yes   Equipment     Treadmill   Comments     6-28mh   Treadmill   MPH 3.6 4 4.5 4.5 6.3   Grade 0 0 0 0 0   Minutes 45 45 45 45 45     10/14/15 0900 10/17/15 1500         Exercise Review   Progression Yes Yes      Response to Exercise   Blood Pressure (Admit)  120/70 mmHg      Blood Pressure (Exercise)  164/72 mmHg       Blood Pressure (Exit)  106/74 mmHg      Heart Rate (Admit)  71 bpm      Heart Rate (Exercise)  124 bpm      Heart Rate (Exit)  74 bpm      Rating of Perceived Exertion (Exercise)  14      Symptoms None None      Comments Increased TM speed due to Onyx being able to run continuously for 40 minutes easily.  RDarricmaintains a steady jog on the TM of 6.5 mph, he pushes himself at the end somtimes and takes it up temporarily a few miles per hour.       Frequency Add 3 additional days to program exercise sessions.  He is maintaining 3d/wk in addition to HT Add 3 additional days to program exercise sessions.  He is maintaining 3d/wk in addition to HT      Duration Progress to 50 minutes of aerobic without signs/symptoms of physical distress Progress to 50 minutes of aerobic without signs/symptoms of physical distress      Intensity Other (comment)  40-85% HRR ~112-154 bpm Other (comment)  40-85% HRR ~112-154 bpm      Progression Continue progressive overload as per policy without signs/symptoms or physical distress. Continue progressive overload as per policy without signs/symptoms or physical distress.      Resistance Training   Training Prescription Yes Yes      Weight 10 10      Reps 10-15 10-15  Interval Training   Interval Training Yes Yes      Equipment Treadmill Treadmill      Comments 6-40mh 6-73m      Treadmill   MPH 6.5 6.5      Grade 0 0      Minutes 45 45         Discharge Exercise Prescription (Final Exercise Prescription Changes):     Exercise Prescription Changes - 10/17/15 1500    Exercise Review   Progression Yes   Response to Exercise   Blood Pressure (Admit) 120/70 mmHg   Blood Pressure (Exercise) 164/72 mmHg   Blood Pressure (Exit) 106/74 mmHg   Heart Rate (Admit) 71 bpm   Heart Rate (Exercise) 124 bpm   Heart Rate (Exit) 74 bpm   Rating of Perceived Exertion (Exercise) 14   Symptoms None   Comments RuBeckeraintains a steady jog on the TM of 6.5  mph, he pushes himself at the end somtimes and takes it up temporarily a few miles per hour.    Frequency Add 3 additional days to program exercise sessions.  He is maintaining 3d/wk in addition to HT   Duration Progress to 50 minutes of aerobic without signs/symptoms of physical distress   Intensity Other (comment)  40-85% HRR ~112-154 bpm   Progression Continue progressive overload as per policy without signs/symptoms or physical distress.   Resistance Training   Training Prescription Yes   Weight 10   Reps 10-15   Interval Training   Interval Training Yes   Equipment Treadmill   Comments 6-54m59m  Treadmill   MPH 6.5   Grade 0   Minutes 45      Nutrition:  Target Goals: Understanding of nutrition guidelines, daily intake of sodium <1500m75mholesterol <200mg29mlories 30% from fat and 7% or less from saturated fats, daily to have 5 or more servings of fruits and vegetables.  Biometrics:     Pre Biometrics - 09/06/15 1033    Pre Biometrics   Height 6' 1.25" (1.861 m)   Weight 187 lb 11.2 oz (85.14 kg)   Waist Circumference 35 inches   Hip Circumference 40 inches   Waist to Hip Ratio 0.88 %   BMI (Calculated) 24.6       Nutrition Therapy Plan and Nutrition Goals:     Nutrition Therapy & Goals - 09/30/15 1141    Nutrition Therapy   Diet Reviewed heart healthy diet principles including DASH diet principles.   Drug/Food Interactions Statins/Certain Fruits   Fiber 30 grams   Whole Grain Foods 3 servings   Protein 9 ounces/day   Saturated Fats 14 max. grams   Fruits and Vegetables 8 servings/day   Personal Nutrition Goals   Personal Goal #1 Continue with present diet changes.   Personal Goal #2 Read labels for saturated fat, trans fat and sodium.   Comments Patient has made positive diet changes including decreasing added fats and sugar in his diet. He is more conscious of his sodium intake and has taken measures to decrease. Overall has a low intake of saturated  fat.      Nutrition Discharge: Rate Your Plate Scores:     Rate Your Plate - 09/1172/14/23 9532ate Your Plate Scores   Pre Score 76.66      Nutrition Goals Re-Evaluation:     Nutrition Goals Re-Evaluation      10/12/15 1533           Personal Goal #1 Re-Evaluation  Goal Progress Seen Yes       Comments Kruze eats very healthy. His BMI is good.           Psychosocial: Target Goals: Acknowledge presence or absence of depression, maximize coping skills, provide positive support system. Participant is able to verbalize types and ability to use techniques and skills needed for reducing stress and depression.  Initial Review & Psychosocial Screening:     Initial Psych Review & Screening - 09/06/15 [place]? Yes   Barriers   Psychosocial barriers to participate in program There are no identifiable barriers or psychosocial needs.;The patient should benefit from training in stress management and relaxation.   Screening Interventions   Interventions Encouraged to exercise      Quality of Life Scores:     Quality of Life - 09/14/15 1247    Quality of Life Scores   Health/Function Pre 24.47 %   Socioeconomic Pre 26.86 %   Psych/Spiritual Pre 26.57 %   Family Pre 28.8 %   GLOBAL Pre 26.03 %      PHQ-9:     Recent Review Flowsheet Data    Depression screen [facility] 2/9 09/06/2015   Decreased Interest 0   Down, Depressed, Hopeless 0   PHQ - 2 Score 0   Altered sleeping 0   Tired, decreased energy 0   Change in appetite 0   Feeling bad or failure about yourself  0   Trouble concentrating 0   Moving slowly or fidgety/restless 0   Suicidal thoughts 0   PHQ-9 Score 0   Difficult doing work/chores Not difficult at all      Psychosocial Evaluation and Intervention:     Psychosocial Evaluation - 10/12/15 1534    Psychosocial Evaluation & Interventions   Interventions Encouraged to exercise with the program and follow  exercise prescription   Comments Sinclair is very consistent in his attendance like his history of exercise in the gym since he feels it helps him.       Psychosocial Re-Evaluation:     Psychosocial Re-Evaluation      10/10/15 0954           Psychosocial Re-Evaluation   Comments Follow up with Mr. Dillenburg stating he has almost reached his cardio workout goal for this program and he is feeling more confident exercising in this capacity.  He has learned to focus more of his workout on cardio versus weight lifting and this has been a much needed adjustment for him with heart disease running in his family.  He has a positive mood and outlook for completing this program.            Vocational Rehabilitation: Provide vocational rehab assistance to qualifying candidates.   Vocational Rehab Evaluation & Intervention:     Vocational Rehab - 09/06/15 0854    Initial Vocational Rehab Evaluation & Intervention   Assessment shows need for Vocational Rehabilitation No      Education: Education Goals: Education classes will be provided on a weekly basis, covering required topics. Participant will state understanding/return demonstration of topics presented.  Learning Barriers/Preferences:     Learning Barriers/Preferences - 09/06/15 0854    Learning Barriers/Preferences   Learning Barriers None   Learning Preferences Written Material      Education Topics: General Nutrition Guidelines/Fats and Fiber: -Group instruction provided by verbal, written material, models and posters to present the general guidelines for heart healthy nutrition. Gives an explanation  and review of dietary fats and fiber.          Cardiac Rehab from 10/24/2015 in [facility] Cardiac and Pulmonary Rehab   Date  10/10/15   Educator  CR   Instruction Review Code  2- meets goals/outcomes      Controlling Sodium/Reading Food Labels: -Group verbal and written material supporting the discussion of sodium use in heart  healthy nutrition. Review and explanation with models, verbal and written materials for utilization of the food label.      Cardiac Rehab from 10/24/2015 in [facility] Cardiac and Pulmonary Rehab   Date  10/17/15   Educator  CR   Instruction Review Code  2- meets goals/outcomes      Exercise Physiology & Risk Factors: - Group verbal and written instruction with models to review the exercise physiology of the cardiovascular system and associated critical values. Details cardiovascular disease risk factors and the goals associated with each risk factor.   Aerobic Exercise & Resistance Training: - Gives group verbal and written discussion on the health impact of inactivity. On the components of aerobic and resistive training programs and the benefits of this training and how to safely progress through these programs.      Cardiac Rehab from 10/24/2015 in [facility] Cardiac and Pulmonary Rehab   Date  09/19/15   Educator  RM   Instruction Review Code  2- meets goals/outcomes      Flexibility, Balance, General Exercise Guidelines: - Provides group verbal and written instruction on the benefits of flexibility and balance training programs. Provides general exercise guidelines with specific guidelines to those with heart or lung disease. Demonstration and skill practice provided.      Cardiac Rehab from 10/24/2015 in [facility] Cardiac and Pulmonary Rehab   Date  09/19/15   Educator  RM   Instruction Review Code  2- meets goals/outcomes      Stress Management: - Provides group verbal and written instruction about the health risks of elevated stress, cause of high stress, and healthy ways to reduce stress.      Cardiac Rehab from 10/24/2015 in [facility] Cardiac and Pulmonary Rehab   Date  09/14/15   Educator  [facility]   Instruction Review Code  2- meets goals/outcomes      Depression: - Provides group verbal and written instruction on the correlation between heart/lung disease and depressed mood, treatment options,  and the stigmas associated with seeking treatment.   Anatomy & Physiology of the Heart: - Group verbal and written instruction and models provide basic cardiac anatomy and physiology, with the coronary electrical and arterial systems. Review of: AMI, Angina, Valve disease, Heart Failure, Cardiac Arrhythmia, Pacemakers, and the ICD.      Cardiac Rehab from 10/24/2015 in [facility] Cardiac and Pulmonary Rehab   Date  09/26/15   Educator  SB   Instruction Review Code  2- meets goals/outcomes      Cardiac Procedures: - Group verbal and written instruction and models to describe the testing methods done to diagnose heart disease. Reviews the outcomes of the test results. Describes the treatment choices: Medical Management, Angioplasty, or Coronary Bypass Surgery.      Cardiac Rehab from 10/24/2015 in [facility] Cardiac and Pulmonary Rehab   Date  10/24/15   Educator  SB   Instruction Review Code  2- meets goals/outcomes      Cardiac Medications: - Group verbal and written instruction to review commonly prescribed medications for heart disease. Reviews the medication, class of the drug, and side  effects. Includes the steps to properly store meds and maintain the prescription regimen.      Cardiac Rehab from 10/24/2015 in [facility] Cardiac and Pulmonary Rehab   Date  10/12/15   Educator  SB   Instruction Review Code  2- meets goals/outcomes      Go Sex-Intimacy & Heart Disease, Get SMART - Goal Setting: - Group verbal and written instruction through game format to discuss heart disease and the return to sexual intimacy. Provides group verbal and written material to discuss and apply goal setting through the application of the S.M.A.R.T. Method.      Cardiac Rehab from 10/24/2015 in [facility] Cardiac and Pulmonary Rehab   Date  10/24/15   Educator  SB   Instruction Review Code  2- meets goals/outcomes      Other Matters of the Heart: - Provides group verbal, written materials and models to describe Heart  Failure, Angina, Valve Disease, and Diabetes in the realm of heart disease. Includes description of the disease process and treatment options available to the cardiac patient.      Cardiac Rehab from 10/24/2015 in [facility] Cardiac and Pulmonary Rehab   Date  10/03/15   Educator  SB   Instruction Review Code  2- meets goals/outcomes      Exercise & Equipment Safety: - Individual verbal instruction and demonstration of equipment use and safety with use of the equipment.      Cardiac Rehab from 10/24/2015 in [facility] Cardiac and Pulmonary Rehab   Date  09/06/15   Educator  SB   Instruction Review Code  2- meets goals/outcomes      Infection Prevention: - Provides verbal and written material to individual with discussion of infection control including proper hand washing and proper equipment cleaning during exercise session.      Cardiac Rehab from 10/24/2015 in [facility] Cardiac and Pulmonary Rehab   Date  09/06/15   Educator  SB   Instruction Review Code  2- meets goals/outcomes      Falls Prevention: - Provides verbal and written material to individual with discussion of falls prevention and safety.      Cardiac Rehab from 10/24/2015 in [facility] Cardiac and Pulmonary Rehab   Date  09/06/15   Educator  Sb   Instruction Review Code  2- meets goals/outcomes      Diabetes: - Individual verbal and written instruction to review signs/symptoms of diabetes, desired ranges of glucose level fasting, after meals and with exercise. Advice that pre and post exercise glucose checks will be done for 3 sessions at entry of program.    Knowledge Questionnaire Score:     Knowledge Questionnaire Score - 09/14/15 1250    Knowledge Questionnaire Score   Pre Score 24      Personal Goals and Risk Factors at Admission:     Personal Goals and Risk Factors at Admission - 09/06/15 0854    Personal Goals and Risk Factors on Admission   Increase Aerobic Exercise and Physical Activity Yes   Intervention While in  program, learn and follow the exercise prescription taught. Start at a low level workload and increase workload after able to maintain previous level for 30 minutes. Increase time before increasing intensity.  Was working out every day, working with  aerobic weight training.  HIgh intensity/low weights.  prior to heart attack.  Would like to return to this and more walking; at least 30 minutes a day.    Take Less Medication Yes   Intervention Learn your  risk factors and begin the lifestyle modifications for risk factor control during your time in the program.   Understand more about Heart/Pulmonary Disease. Yes   Intervention While in program utilize professionals for any questions, and attend the education sessions. Great websites to use are www.americanheart.org or www.lung.org for reliable information.   Hypertension Yes   Goal Participant will see blood pressure controlled within the values of 140/24m/Hg or within value directed by their physician.   Intervention Provide nutrition & aerobic exercise along with prescribed medications to achieve BP 140/90 or less.   Lipids Yes   Goal Cholesterol controlled with medications as prescribed, with individualized exercise RX and with personalized nutrition plan. Value goals: LDL < 714m HDL > 4075mParticipant states understanding of desired cholesterol values and following prescriptions.   Intervention Provide nutrition & aerobic exercise along with prescribed medications to achieve LDL <77m59mDL >40mg84mStress Yes   Goal To meet with psychosocial counselor for stress and relaxation information and guidance. To state understanding of performing relaxation techniques and or identifying personal stressors.   Intervention Provide education on types of stress, identifiying stressors, and ways to cope with stress. Provide demonstration and active practice of relaxation techniques.  Stress at work. Has been lowered since returned to work.   [employer] duties have  changed to alleviate the major work stress.  This has made a huge difference for Terrelle.       Personal Goals and Risk Factors Review:      Goals and Risk Factor Review      09/14/15 1207 09/20/15 0659 10/12/15 1533 10/17/15 1512     Increase Aerobic Exercise and Physical Activity   Goals Progress/Improvement seen  Yes Yes      Comments RusseHamishs [facility] regularly at [facility] [employer] a lot of experience with exercise. He has been cleared by his MD to lift more than 15 lbs. He exercises well in class and often states that it is very easy for him. We will follow up with him in the next few weeks with exercise challenges.  We have progressed RusseEriqueelp make the exercise more challenging. He is now doing jogging intervals on the TM and feels good about it. He has increased his dumbbell weight to 10lbs and we have given him extra challenges he can do during the weight training time. He continues to exercise on his own 3 days in addittion to class. We will followup with him on how he is maintaining this next month.   RusseZaviyaraintaining his jogging well on the TM and can now jog continuously for the entire 45 minute aerobic session. His goal is continuous jogging so we have removed the intervals and kept the speed at 6.5mph.[id number] want to use other modes of exercise at this time and wants to focus on his running. We will follow up with him on any new goals he would like to set for his final few weeks in the program.     Hypertension   Progress seen toward goals   Yes     Comments   RusselBurl very stable blood pressure.      Abnormal Lipids   Progress seen towards goals   Unknown     Stress   Progress seen towards goals   Yes     Comments   Exercise helps with stress.         Personal Goals Discharge (Final Personal Goals  and Risk Factors Review):      Goals and Risk Factor Review - 10/17/15 1512    Increase Aerobic Exercise and Physical Activity   Comments Durwin  is maintaining his jogging well on the TM and can now jog continuously for the entire 45 minute aerobic session. His goal is continuous jogging so we have removed the intervals and kept the speed at 6.27mh. RVancedoes not want to use other modes of exercise at this time and wants to focus on his running. We will follow up with him on any new goals he would like to set for his final few weeks in the program.       ITP Comments:     ITP Comments      09/06/15 1223 09/28/15 0920 09/29/15 1200 10/12/15 1530 10/25/15 0742   ITP Comments Initial ITP Continue with ITP Has chosen to meet with our dietician this week.   Ready for 30 day review. Continue with ITP. RAnjeloreports he has lost 10 lbs. He is exersing on the treadmill alot. His blood pressure is very stable and good. Makani's primary care will follow up with his cholestrol/lipid panel blood work and order that.  30 day review.  Continue with ITP      Comments:

## 2015-10-26 DIAGNOSIS — Z9861 Coronary angioplasty status: Secondary | ICD-10-CM

## 2015-10-26 DIAGNOSIS — I213 ST elevation (STEMI) myocardial infarction of unspecified site: Secondary | ICD-10-CM | POA: Diagnosis not present

## 2015-10-26 NOTE — Progress Notes (Signed)
Daily Session Note  Patient Details  Name: George Jimenez MRN: [id number] Date of Birth: 19-Jun-1956 Referring Provider:  Rusty Aus, MD  Encounter Date: 10/26/2015  Check In:     Session Check In - 10/26/15 0816    Check-In   Location [facility]   Staff Present Heath Lark, RN, BSN, CCRP;Renee Dillard Essex, MS, ACSM CEP, Exercise Physiologist;Aditri Louischarles, BS, ACSM EP-C, Exercise Physiologist   Supervising physician immediately available to respond to emergencies See telemetry face sheet for immediately available ER MD   Medication changes reported     No   Fall or balance concerns reported    No   Warm-up and Cool-down Performed on first and last piece of equipment   Resistance Training Performed No   VAD Patient? No   Pain Assessment   Currently in Pain? No/denies         Goals Met:  Proper associated with RPD/PD & O2 Sat Exercise tolerated well No report of cardiac concerns or symptoms Strength training completed today  Goals Unmet:  Not Applicable  Goals Comments:    [name] is Medical Director for [facility] [facility].

## 2015-10-28 ENCOUNTER — Encounter: Payer: BLUE CROSS/BLUE SHIELD | Admitting: *Deleted

## 2015-10-28 DIAGNOSIS — I213 ST elevation (STEMI) myocardial infarction of unspecified site: Secondary | ICD-10-CM | POA: Diagnosis not present

## 2015-10-28 DIAGNOSIS — Z9861 Coronary angioplasty status: Secondary | ICD-10-CM

## 2015-10-28 NOTE — Progress Notes (Signed)
Daily Session Note  Patient Details  Name: George Jimenez MRN: [id number] Date of Birth: 05-Nov-1955 Referring Provider:  [name], MD  Encounter Date: 10/28/2015  Check In:     Session Check In - 10/28/15 0913    Check-In   Staff Present Gerlene Burdock, RN, BSN;Susanne Bice, RN, BSN, CCRP;Renee Dillard Essex, MS, ACSM CEP, Exercise Physiologist   Supervising physician immediately available to respond to emergencies See telemetry face sheet for immediately available ER MD   Medication changes reported     No   Fall or balance concerns reported    No   Warm-up and Cool-down Performed on first and last piece of equipment   Resistance Training Performed No   VAD Patient? No   Pain Assessment   Currently in Pain? No/denies         Goals Met:  Independence with exercise equipment Exercise tolerated well No report of cardiac concerns or symptoms Strength training completed today  Goals Unmet:  Not Applicable  Goals Comments: Doing well with exercise prescription progression.    [name] is Medical Director for [place] and [facility].

## 2015-10-31 ENCOUNTER — Encounter: Payer: BLUE CROSS/BLUE SHIELD | Admitting: *Deleted

## 2015-10-31 DIAGNOSIS — I213 ST elevation (STEMI) myocardial infarction of unspecified site: Secondary | ICD-10-CM

## 2015-10-31 DIAGNOSIS — Z9861 Coronary angioplasty status: Secondary | ICD-10-CM

## 2015-10-31 NOTE — Progress Notes (Signed)
Cardiac Individual Treatment Plan  Patient Details  Name: George Jimenez MRN: [id number] Date of Birth: [birth year] Referring Provider:  [name], MD  Initial Encounter Date:    Visit Diagnosis: S/P PTCA (percutaneous transluminal coronary angioplasty)  ST elevation myocardial infarction (STEMI), unspecified artery ([place])  Patient's Home Medications on Admission:  Current outpatient prescriptions:  .  aspirin EC 81 MG tablet, Take 81 mg by mouth daily., Disp: , Rfl:  .  atorvastatin (LIPITOR) 80 MG tablet, Take 1 tablet (80 mg total) by mouth daily at 6 PM., Disp: 30 tablet, Rfl: 11 .  carvedilol (COREG) 3.125 MG tablet, Take 1 tablet (3.125 mg total) by mouth 2 (two) times daily with a meal., Disp: 30 tablet, Rfl: 11 .  losartan (COZAAR) 50 MG tablet, Take 50 mg by mouth daily., Disp: , Rfl: 12 .  Multiple Vitamin (MULTIVITAMIN WITH MINERALS) TABS tablet, Take 1 tablet by mouth daily. Reported on 09/06/2015, Disp: , Rfl:  .  nitroGLYCERIN (NITROSTAT) 0.4 MG SL tablet, Place 1 tablet (0.4 mg total) under the tongue every 5 (five) minutes as needed for chest pain., Disp: 25 tablet, Rfl: 12 .  ticagrelor (BRILINTA) 90 MG TABS tablet, Take 1 tablet (90 mg total) by mouth 2 (two) times daily., Disp: 60 tablet, Rfl: 6  Past Medical History: Past Medical History  Diagnosis Date  . HTN (hypertension)   . Diverticulitis   . Dyslipidemia     low HDL  . STEMI (ST elevation myocardial infarction) ([place]) 08/17/2015    DES LAD    Tobacco Use: History  Smoking status  . Never Smoker   Smokeless tobacco  . Not on file    Labs: Recent Review Flowsheet Data    Labs for ITP Cardiac and Pulmonary Rehab Latest Ref Rng 08/18/2015   Cholestrol 0 - 200 mg/dL 149   LDLCALC 0 - 99 mg/dL 94   HDL >40 mg/dL 29(L)   Trlycerides <150 mg/dL 130       Exercise Target Goals:    Exercise Program Goal: Individual exercise prescription set with THRR, safety & activity barriers. Participant  demonstrates ability to understand and report RPE using BORG scale, to self-measure pulse accurately, and to acknowledge the importance of the exercise prescription.  Exercise Prescription Goal: Starting with aerobic activity 30 plus minutes a day, 3 days per week for initial exercise prescription. Provide home exercise prescription and guidelines that participant acknowledges understanding prior to discharge.  Activity Barriers & Risk Stratification:     Activity Barriers & Cardiac Risk Stratification - 09/06/15 0853    Activity Barriers & Cardiac Risk Stratification   Activity Barriers None   Cardiac Risk Stratification High      6 Minute Walk:     6 Minute Walk      09/06/15 1034 10/31/15 0945     6 Minute Walk   Phase Initial Discharge    Distance 1743 feet 2000 feet    Distance % Change  15 %    Walk Time 6 minutes 6 minutes    # of Rest Breaks  0    RPE 13 6    Symptoms No     Resting HR 66 bpm 66 bpm    Resting BP 126/88 mmHg 124/82 mmHg    Max Ex. HR 87 bpm 107 bpm    Max Ex. BP 148/88 mmHg 150/80 mmHg       Initial Exercise Prescription:     Initial Exercise Prescription - 09/06/15 1000  Date of Initial Exercise Prescription   Date 09/06/15   Treadmill   MPH 3   Grade 0   Minutes 10   Bike   Level 0.4   Minutes 15   Recumbant Bike   Level 4   RPM 45   Watts 30   Minutes 15   NuStep   Level 3   Watts 30   Minutes 15   Arm Ergometer   Level 1   Watts 8   Minutes 10   Arm/Foot Ergometer   Level 4   Watts 12   Minutes 10   Cybex   Level 3   RPM 50   Minutes 15   Recumbant Elliptical   Level 2   RPM 40   Watts 15   Minutes 15   Elliptical   Level 1   Speed 3   Minutes 1   REL-XR   Level 3   Watts 45   Minutes 15   T5 Nustep   Level 2   Watts 20   Minutes 15   Biostep-RELP   Level 3   Watts 20   Minutes 15   Prescription Details   Frequency (times per week) 3   Duration Progress to 30 minutes of continuous aerobic  without signs/symptoms of physical distress   Intensity   THRR REST +  30   Ratings of Perceived Exertion 11-15   Progression Continue progressive overload as per policy without signs/symptoms or physical distress.   Resistance Training   Training Prescription Yes   Weight 3   Reps 10-15      Exercise Prescription Changes:     Exercise Prescription Changes      09/09/15 1200 09/14/15 1200 09/19/15 0657 09/20/15 0700 10/12/15 0800   Exercise Review   Progression  Yes Yes Yes Yes   Response to Exercise   Blood Pressure (Admit)   114/78 mmHg     Blood Pressure (Exercise)   148/74 mmHg     Blood Pressure (Exit)   130/78 mmHg     Heart Rate (Admit)   75 bpm     Heart Rate (Exercise)   132 bpm     Heart Rate (Exit)   74 bpm     Symptoms None None None None None   Comments First day of exercise! Patient was oriented to the gym and the equipment functions and settings. Procedures and policies of the gym were outlined and explained. The patient's individual exercise prescription and treatment plan were reviewed with them. All starting workloads were established based on the results of the functional testing  done at the initial intake visit. The plan for exercise progression was also introduced and progression will be customized based on the patient's performance and goals.  George Jimenez is an avid exerciser and can exercise for the entire class time. His progression will now involve increases in intensity. He lifts weights regularly and was cleared by his MD to lift more than 15 lbs.  George Jimenez is progressing his intervals and has started jogging on the TM. He progresses well on his own and is very independent in his exercise. He has a clear understanding of what he needs to do. He walks and lifts weights outside of class. A new target heart rate range for exercise was calculated based on the patient's ability to exercise with increased intensity. The range is 40-85% of HRR and encompasses  moderate-vigorous exercise. The target heart rate range is equivalent to an RPE of  12-17.  Reviewed individualized exercise prescription and made increases per departmental policy. Exercise increases were discussed with the patient and they were able to perform the new work loads without issue (no signs or symptoms).    Frequency  Add 3 additional days to program exercise sessions. Add 3 additional days to program exercise sessions. Add 3 additional days to program exercise sessions. Add 3 additional days to program exercise sessions.  He is maintaining 3d/wk in addition to HT   Duration Progress to 50 minutes of aerobic without signs/symptoms of physical distress Progress to 50 minutes of aerobic without signs/symptoms of physical distress Progress to 50 minutes of aerobic without signs/symptoms of physical distress Progress to 50 minutes of aerobic without signs/symptoms of physical distress Progress to 50 minutes of aerobic without signs/symptoms of physical distress   Intensity Rest + 30 Rest + 30 Rest + 30 Other (comment)  40-85% HRR ~112-154 bpm Other (comment)  40-85% HRR ~112-154 bpm   Progression Continue progressive overload as per policy without signs/symptoms or physical distress. Continue progressive overload as per policy without signs/symptoms or physical distress. Continue progressive overload as per policy without signs/symptoms or physical distress. Continue progressive overload as per policy without signs/symptoms or physical distress. Continue progressive overload as per policy without signs/symptoms or physical distress.   Resistance Training   Training Prescription Yes Yes Yes Yes Yes   Weight 5 10 10 10 10    Reps 10-15 10-15 10-15 10-15 10-15   Interval Training   Interval Training     Yes   Equipment     Treadmill   Comments     6-94mh   Treadmill   MPH 3.6 4 4.5 4.5 6.3   Grade 0 0 0 0 0   Minutes 45 45 45 45 45     10/14/15 0900 10/17/15 1500 10/31/15 0800        Exercise Review   Progression Yes Yes Yes     Response to Exercise   Blood Pressure (Admit)  120/70 mmHg      Blood Pressure (Exercise)  164/72 mmHg      Blood Pressure (Exit)  106/74 mmHg      Heart Rate (Admit)  71 bpm      Heart Rate (Exercise)  124 bpm      Heart Rate (Exit)  74 bpm      Rating of Perceived Exertion (Exercise)  14      Symptoms None None None     Comments Increased TM speed due to [place] being able to run continuously for 40 minutes easily.  RAlbaraamaintains a steady jog on the TM of 6.5 mph, he pushes himself at the end somtimes and takes it up temporarily a few miles per hour.  RLavinhas increased his running speed to 725m/2%. He is tolerating his new intensity well and has had no signs or symptoms associated with this increase.     Frequency Add 3 additional days to program exercise sessions.  He is maintaining 3d/wk in addition to HT Add 3 additional days to program exercise sessions.  He is maintaining 3d/wk in addition to HT Add 3 additional days to program exercise sessions.  He is maintaining 3d/wk in addition to HT     Duration Progress to 50 minutes of aerobic without signs/symptoms of physical distress Progress to 50 minutes of aerobic without signs/symptoms of physical distress Progress to 50 minutes of aerobic without signs/symptoms of physical distress     Intensity Other (  comment)  40-85% HRR ~112-154 bpm Other (comment)  40-85% HRR ~112-154 bpm Other (comment)  40-85% HRR ~112-154 bpm     Progression Continue progressive overload as per policy without signs/symptoms or physical distress. Continue progressive overload as per policy without signs/symptoms or physical distress. Continue progressive overload as per policy without signs/symptoms or physical distress.     Resistance Training   Training Prescription Yes Yes Yes     Weight 10 10 10      Reps 10-15 10-15 10-15     Interval Training   Interval Training Yes Yes Yes     Equipment Treadmill  Treadmill Treadmill     Comments 6-59mh 6-742m 6-66m89m    Treadmill   MPH 6.5 6.5 7     Grade 0 0 2     [id number] 41 32 44  Home Exercise Plan   [facility]  [facility] at [facility]     Discharge Exercise Prescription (Final Exercise Prescription Changes):     Exercise Prescription Changes - 10/31/15 0800    Exercise Review   Progression Yes   Response to Exercise   Symptoms None   Comments RusBlades increased his running speed to 66mp62m%. He is tolerating his new intensity well and has had no signs or symptoms associated with this increase.   Frequency Add 3 additional days to program exercise sessions.  He is maintaining 3d/wk in addition to HT   Duration Progress to 50 minutes of aerobic without signs/symptoms of physical distress   Intensity Other (comment)  40-85% HRR ~112-154 bpm   Progression Continue progressive overload as per policy without signs/symptoms or physical distress.   Resistance Training   Training Prescription Yes   Weight 10   Reps 10-15   Interval Training   Interval Training Yes   Equipment Treadmill   Comments 6-66mph5mTreadmill   MPH 7   Grade 2   Minutes 45   Home Exercise Plan   [facility]  TM at home and weight training at [facility]      Nutrition:  Target Goals: Understanding of nutrition guidelines, daily intake of sodium <1500mg,66mlesterol <200mg, 75mries 30% from fat and 7% or less from saturated fats, daily to have 5 or more servings of fruits and vegetables.  Biometrics:     Pre Biometrics - 09/06/15 1033    Pre Biometrics   Height 6' 1.25" (1.861 m)   Weight 187 lb 11.2 oz (85.14 kg)   Waist Circumference 35 inches   Hip Circumference 40 inches   Waist to Hip Ratio 0.88 %   BMI (Calculated) 24.6       Nutrition Therapy Plan and Nutrition Goals:     Nutrition Therapy & Goals - 09/30/15 1141     Nutrition Therapy   Diet Reviewed heart healthy diet principles including DASH diet principles.   Drug/Food Interactions Statins/Certain Fruits   Protein (oz) (read-only) 9 ounces/day   Fiber 30 grams   Whole Grain Foods 3 servings   Saturated Fats 14 max. grams   Fruits and Vegetables 8 servings/day   Personal Nutrition Goals   Personal Goal #1 Continue with present diet changes.   Personal Goal #2 Read labels for saturated fat, trans fat and sodium.   Comments Patient has made positive diet changes including decreasing added fats and sugar in  his diet. He is more conscious of his sodium intake and has taken measures to decrease. Overall has a low intake of saturated fat.      Nutrition Discharge: Rate Your Plate Scores:     Nutrition Assessments - 10/27/15 1446    Rate Your Plate Scores   Pre Score 76.66   Pre Score % 84 %   Post Score 67   Post Score % 75 %   % Change -9 %      Nutrition Goals Re-Evaluation:     Nutrition Goals Re-Evaluation      10/12/15 1533 10/31/15 1052         Personal Goal #1 Re-Evaluation   Goal Progress Seen Yes Yes      Comments Boomer eats very healthy. His BMI is good.        Personal Goal #2 Re-Evaluation   Goal Progress Seen  Yes         Psychosocial: Target Goals: Acknowledge presence or absence of depression, maximize coping skills, provide positive support system. Participant is able to verbalize types and ability to use techniques and skills needed for reducing stress and depression.  Initial Review & Psychosocial Screening:     Initial Psych Review & Screening - 09/06/15 [place]? Yes   Barriers   Psychosocial barriers to participate in program There are no identifiable barriers or psychosocial needs.;The patient should benefit from training in stress management and relaxation.   Screening Interventions   Interventions Encouraged to exercise      Quality of Life Scores:      Quality of Life - 10/27/15 1447    Quality of Life Scores   Health/Function Pre 24.47 %   Health/Function Post 30 %   Health/Function % Change 23 %   Socioeconomic Pre 26.86 %   Socioeconomic Post 30 %   Socioeconomic % Change 12 %   Psych/Spiritual Pre 26.57 %   Psych/Spiritual Post 30 %   Psych/Spiritual % Change 13 %   Family Pre 28.8 %   Family Post 30 %   Family % Change 4 %   GLOBAL Pre 26.03 %   GLOBAL Post 30 %   GLOBAL % Change 15 %      PHQ-9:     Recent Review Flowsheet Data    Depression screen [facility] 2/9 10/27/2015 09/06/2015   Decreased Interest 0 0   Down, Depressed, Hopeless 0 0   PHQ - 2 Score 0 0   Altered sleeping 0 0   Tired, decreased energy 0 0   Change in appetite 0 0   Feeling bad or failure about yourself  0 0   Trouble concentrating 0 0   Moving slowly or fidgety/restless 0 0   Suicidal thoughts 0 0   PHQ-9 Score 0 0   Difficult doing work/chores Not difficult at all Not difficult at all      Psychosocial Evaluation and Intervention:     Psychosocial Evaluation - 10/12/15 1534    Psychosocial Evaluation & Interventions   Interventions Encouraged to exercise with the program and follow exercise prescription   Comments Merrit is very consistent in his attendance like his history of exercise in the gym since he feels it helps him.       Psychosocial Re-Evaluation:     Psychosocial Re-Evaluation      10/10/15 0954 10/31/15 1053         Psychosocial Re-Evaluation  Comments Follow up with Mr. Suydam stating he has almost reached his cardio workout goal for this program and he is feeling more confident exercising in this capacity.  He has learned to focus more of his workout on cardio versus weight lifting and this has been a much needed adjustment for him with heart disease running in his family.  He has a positive mood and outlook for completing this program.   Decari said he is ready to finish Cardiac Rehab.          Vocational  Rehabilitation: Provide vocational rehab assistance to qualifying candidates.   Vocational Rehab Evaluation & Intervention:     Vocational Rehab - 09/06/15 0854    Initial Vocational Rehab Evaluation & Intervention   Assessment shows need for Vocational Rehabilitation No      Education: Education Goals: Education classes will be provided on a weekly basis, covering required topics. Participant will state understanding/return demonstration of topics presented.  Learning Barriers/Preferences:     Learning Barriers/Preferences - 09/06/15 0854    Learning Barriers/Preferences   Learning Barriers None   Learning Preferences Written Material      Education Topics: General Nutrition Guidelines/Fats and Fiber: -Group instruction provided by verbal, written material, models and posters to present the general guidelines for heart healthy nutrition. Gives an explanation and review of dietary fats and fiber.          Cardiac Rehab from 10/31/2015 in [facility] Cardiac and Pulmonary Rehab   Date  10/10/15   Educator  CR   Instruction Review Code  2- meets goals/outcomes      Controlling Sodium/Reading Food Labels: -Group verbal and written material supporting the discussion of sodium use in heart healthy nutrition. Review and explanation with models, verbal and written materials for utilization of the food label.      Cardiac Rehab from 10/31/2015 in [facility] Cardiac and Pulmonary Rehab   Date  10/17/15   Educator  CR   Instruction Review Code  2- meets goals/outcomes      Exercise Physiology & Risk Factors: - Group verbal and written instruction with models to review the exercise physiology of the cardiovascular system and associated critical values. Details cardiovascular disease risk factors and the goals associated with each risk factor.      Cardiac Rehab from 10/31/2015 in [facility] Cardiac and Pulmonary Rehab   Date  10/31/15   Educator  RM   Instruction Review Code  2- meets  goals/outcomes      Aerobic Exercise & Resistance Training: - Gives group verbal and written discussion on the health impact of inactivity. On the components of aerobic and resistive training programs and the benefits of this training and how to safely progress through these programs.      Cardiac Rehab from 10/31/2015 in [facility] Cardiac and Pulmonary Rehab   Date  09/19/15   Educator  RM   Instruction Review Code  2- meets goals/outcomes      Flexibility, Balance, General Exercise Guidelines: - Provides group verbal and written instruction on the benefits of flexibility and balance training programs. Provides general exercise guidelines with specific guidelines to those with heart or lung disease. Demonstration and skill practice provided.      Cardiac Rehab from 10/31/2015 in [facility] Cardiac and Pulmonary Rehab   Date  09/19/15   Educator  RM   Instruction Review Code  2- meets goals/outcomes      Stress Management: - Provides group verbal and written instruction about the health risks  of elevated stress, cause of high stress, and healthy ways to reduce stress.      Cardiac Rehab from 10/31/2015 in [facility] Cardiac and Pulmonary Rehab   Date  09/14/15   Educator  [facility]   Instruction Review Code  2- meets goals/outcomes      Depression: - Provides group verbal and written instruction on the correlation between heart/lung disease and depressed mood, treatment options, and the stigmas associated with seeking treatment.      Cardiac Rehab from 10/31/2015 in [facility] Cardiac and Pulmonary Rehab   Date  10/26/15   Educator  SB   Instruction Review Code  2- meets goals/outcomes      Anatomy & Physiology of the Heart: - Group verbal and written instruction and models provide basic cardiac anatomy and physiology, with the coronary electrical and arterial systems. Review of: AMI, Angina, Valve disease, Heart Failure, Cardiac Arrhythmia, Pacemakers, and the ICD.      Cardiac Rehab from 10/31/2015 in [facility]  Cardiac and Pulmonary Rehab   Date  09/26/15   Educator  SB   Instruction Review Code  2- meets goals/outcomes      Cardiac Procedures: - Group verbal and written instruction and models to describe the testing methods done to diagnose heart disease. Reviews the outcomes of the test results. Describes the treatment choices: Medical Management, Angioplasty, or Coronary Bypass Surgery.      Cardiac Rehab from 10/31/2015 in [facility] Cardiac and Pulmonary Rehab   Date  10/24/15   Educator  SB   Instruction Review Code  2- meets goals/outcomes      Cardiac Medications: - Group verbal and written instruction to review commonly prescribed medications for heart disease. Reviews the medication, class of the drug, and side effects. Includes the steps to properly store meds and maintain the prescription regimen.      Cardiac Rehab from 10/31/2015 in [facility] Cardiac and Pulmonary Rehab   Date  10/12/15   Educator  SB   Instruction Review Code  2- meets goals/outcomes      Go Sex-Intimacy & Heart Disease, Get SMART - Goal Setting: - Group verbal and written instruction through game format to discuss heart disease and the return to sexual intimacy. Provides group verbal and written material to discuss and apply goal setting through the application of the S.M.A.R.T. Method.      Cardiac Rehab from 10/31/2015 in [facility] Cardiac and Pulmonary Rehab   Date  10/24/15   Educator  SB   Instruction Review Code  2- meets goals/outcomes      Other Matters of the Heart: - Provides group verbal, written materials and models to describe Heart Failure, Angina, Valve Disease, and Diabetes in the realm of heart disease. Includes description of the disease process and treatment options available to the cardiac patient.      Cardiac Rehab from 10/31/2015 in [facility] Cardiac and Pulmonary Rehab   Date  10/03/15   Educator  SB   Instruction Review Code  2- meets goals/outcomes      Exercise & Equipment Safety: - Individual  verbal instruction and demonstration of equipment use and safety with use of the equipment.      Cardiac Rehab from 10/31/2015 in [facility] Cardiac and Pulmonary Rehab   Date  09/06/15   Educator  SB   Instruction Review Code  2- meets goals/outcomes      Infection Prevention: - Provides verbal and written material to individual with discussion of infection control including proper hand washing and  proper equipment cleaning during exercise session.      Cardiac Rehab from 10/31/2015 in [facility] Cardiac and Pulmonary Rehab   Date  09/06/15   Educator  SB   Instruction Review Code  2- meets goals/outcomes      Falls Prevention: - Provides verbal and written material to individual with discussion of falls prevention and safety.      Cardiac Rehab from 10/31/2015 in [facility] Cardiac and Pulmonary Rehab   Date  09/06/15   Educator  Sb   Instruction Review Code  2- meets goals/outcomes      Diabetes: - Individual verbal and written instruction to review signs/symptoms of diabetes, desired ranges of glucose level fasting, after meals and with exercise. Advice that pre and post exercise glucose checks will be done for 3 sessions at entry of program.    Knowledge Questionnaire Score:     Knowledge Questionnaire Score - 10/27/15 1447    Knowledge Questionnaire Score   Pre Score 24   Post Score 26/28      Personal Goals and Risk Factors at Admission:     Personal Goals and Risk Factors at Admission - 09/06/15 0854    Core Components/Risk Factors/Patient Goals on Admission   Sedentary Yes   Intervention (read-only) While in program, learn and follow the exercise prescription taught. Start at a low level workload and increase workload after able to maintain previous level for 30 minutes. Increase time before increasing intensity.  Was working out every day, working with  aerobic weight training.  HIgh intensity/low weights.  prior to heart attack.  Would like to return to this and more walking; at  least 30 minutes a day.    Hypertension Yes   Goal Participant will see blood pressure controlled within the values of 140/6m/Hg or within value directed by their physician.   Intervention (read-only) Provide nutrition & aerobic exercise along with prescribed medications to achieve BP 140/90 or less.   Lipids Yes   Goal Cholesterol controlled with medications as prescribed, with individualized exercise RX and with personalized nutrition plan. Value goals: LDL < 720m HDL > 4017mParticipant states understanding of desired cholesterol values and following prescriptions.   Intervention (read-only) Provide nutrition & aerobic exercise along with prescribed medications to achieve LDL <32m55mDL >40mg51mStress Yes   Goal To meet with psychosocial counselor for stress and relaxation information and guidance. To state understanding of performing relaxation techniques and or identifying personal stressors.   Intervention (read-only) Provide education on types of stress, identifiying stressors, and ways to cope with stress. Provide demonstration and active practice of relaxation techniques.  Stress at work. Has been lowered since returned to work.   JOb duties have changed to alleviate the major work stress.  This has made a huge difference for Kellie.    Take Less Medication Yes   Intervention Learn your risk factors and begin the lifestyle modifications for risk factor control during your time in the program.   Understand more about Heart/Pulmonary Disease. Yes   Intervention While in program utilize professionals for any questions, and attend the education sessions. Great websites to use are www.americanheart.org or www.lung.org for reliable information.      Personal Goals and Risk Factors Review:      Goals and Risk Factor Review      09/14/15 1207 09/20/15 0659 10/12/15 1533 10/17/15 1512 10/31/15 1052   Core Components/Risk Factors/Patient Goals Review   Personal Goals Review Increase  Aerobic Exercise and Physical Activity  Hypertension;Lipids;Stress     Review     Russelll said his MD told him his lipid blood work is now good. His blood pressure is under control. He cont. to take his medication, exercise and eat well.   Increase Aerobic Exercise and Physical Activity (read-only)   Goals Progress/Improvement seen  Yes Yes      Comments Duriel works out regularly at the [employer] and has a lot of experience with exercise. He has been cleared by his MD to lift more than 15 lbs. He exercises well in class and often states that it is very easy for him. We will follow up with him in the next few weeks with exercise challenges.  We have progressed Damiano to help make the exercise more challenging. He is now doing jogging intervals on the TM and feels good about it. He has increased his dumbbell weight to 10lbs and we have given him extra challenges he can do during the weight training time. He continues to exercise on his own 3 days in addittion to class. We will followup with him on how he is maintaining this next month.   Dev is maintaining his jogging well on the TM and can now jog continuously for the entire 45 minute aerobic session. His goal is continuous jogging so we have removed the intervals and kept the speed at 6.82mh. RZayvierdoes not want to use other modes of exercise at this time and wants to focus on his running. We will follow up with him on any new goals he would like to set for his final few weeks in the program.     Hypertension (read-only)   Progress seen toward goals   Yes     Comments   RAttikushas a very stable blood pressure.      Abnormal Lipids (read-only)   Progress seen towards goals   George     Stress (read-only)   Progress seen towards goals   Yes     Comments   Exercise helps with stress.         Personal Goals Discharge (Final Personal Goals and Risk Factors Review):      Goals and Risk Factor Review - 10/31/15 1052    Core Components/Risk  Factors/Patient Goals Review   Review Russelll said his MD told him his lipid blood work is now good. His blood pressure is under control. He cont. to take his medication, exercise and eat well.      ITP Comments:     ITP Comments      09/06/15 1223 09/28/15 0920 09/29/15 1200 10/12/15 1530 10/25/15 0742   ITP Comments Initial ITP Continue with ITP Has chosen to meet with our dietician this week.   Ready for 30 day review. Continue with ITP. RSadlerreports he has lost 10 lbs. He is exersing on the treadmill alot. His blood pressure is very stable and good. Joan's primary care will follow up with his cholestrol/lipid panel blood work and order that.  30 day review.  Continue with ITP      Comments: Ready for discharge. Has completed 34/36 sessions.

## 2015-10-31 NOTE — Progress Notes (Addendum)
Discharge Summary  Patient Details  Name: George Jimenez MRN: [id number] Date of Birth: 1955-11-21 Referring Provider:  [name], MD   Number of Visits:   Reason for Discharge:  Patient reached a stable level of exercise. Patient independent in their exercise.  Smoking History:  History  Smoking status  . Never Smoker   Smokeless tobacco  . Not on file    Diagnosis:  S/P PTCA (percutaneous transluminal coronary angioplasty)  ST elevation myocardial infarction (STEMI), unspecified artery (HCC)  ADL UCSD:   Initial Exercise Prescription:     Initial Exercise Prescription - 09/06/15 1000    Date of Initial Exercise Prescription   Date 09/06/15   Treadmill   MPH 3   Grade 0   Minutes 10   Bike   Level 0.4   Minutes 15   Recumbant Bike   Level 4   RPM 45   Watts 30   Minutes 15   NuStep   Level 3   Watts 30   Minutes 15   Arm Ergometer   Level 1   Watts 8   Minutes 10   Arm/Foot Ergometer   Level 4   Watts 12   Minutes 10   Cybex   Level 3   RPM 50   Minutes 15   Recumbant Elliptical   Level 2   RPM 40   Watts 15   Minutes 15   Elliptical   Level 1   Speed 3   Minutes 1   REL-XR   Level 3   Watts 45   Minutes 15   T5 Nustep   Level 2   Watts 20   Minutes 15   Biostep-RELP   Level 3   Watts 20   Minutes 15   Prescription Details   Frequency (times per week) 3   Duration Progress to 30 minutes of continuous aerobic without signs/symptoms of physical distress   Intensity   THRR REST +  30   Ratings of Perceived Exertion 11-15   Progression Continue progressive overload as per policy without signs/symptoms or physical distress.   Resistance Training   Training Prescription Yes   Weight 3   Reps 10-15      Discharge Exercise Prescription (Final Exercise Prescription Changes):     Exercise Prescription Changes - 10/31/15 0800    Exercise Review   Progression Yes   Response to Exercise   Symptoms None   Comments Xavyer  has increased his running speed to 55mh/2%. He is tolerating his new intensity well and has had no signs or symptoms associated with this increase.   Frequency Add 3 additional days to program exercise sessions.  He is maintaining 3d/wk in addition to HT   Duration Progress to 50 minutes of aerobic without signs/symptoms of physical distress   Intensity Other (comment)  40-85% HRR ~112-154 bpm   Progression Continue progressive overload as per policy without signs/symptoms or physical distress.   Resistance Training   Training Prescription Yes   Weight 10   Reps 10-15   Interval Training   Interval Training Yes   Equipment Treadmill   Comments 6-771m   Treadmill   MPH 7   Grade 2   Minutes 45   Home Exercise Plan   [facility]  TM at home and weight training at [facility]      Functional Capacity:     6 Minute Walk  09/06/15 1034 10/31/15 0945     6 Minute Walk   Phase Initial Discharge    Distance 1743 feet 2000 feet    Distance % Change  15 %    Walk Time 6 minutes 6 minutes    # of Rest Breaks  0    RPE 13 6    Symptoms No     Resting HR 66 bpm 66 bpm    Resting BP 126/88 mmHg 124/82 mmHg    Max Ex. HR 87 bpm 107 bpm    Max Ex. BP 148/88 mmHg 150/80 mmHg       Psychological, QOL, Others - Outcomes: PHQ 2/9: Depression screen [facility] 2/9 10/27/2015 09/06/2015  Decreased Interest 0 0  Down, Depressed, Hopeless 0 0  PHQ - 2 Score 0 0  Altered sleeping 0 0  Tired, decreased energy 0 0  Change in appetite 0 0  Feeling bad or failure about yourself  0 0  Trouble concentrating 0 0  Moving slowly or fidgety/restless 0 0  Suicidal thoughts 0 0  PHQ-9 Score 0 0  Difficult doing work/chores Not difficult at all Not difficult at all    Quality of Life:     Quality of Life - 10/27/15 1447    Quality of Life Scores   Health/Function Pre 24.47 %   Health/Function Post 30 %   Health/Function % Change 23 %    Socioeconomic Pre 26.86 %   Socioeconomic Post 30 %   Socioeconomic % Change 12 %   Psych/Spiritual Pre 26.57 %   Psych/Spiritual Post 30 %   Psych/Spiritual % Change 13 %   Family Pre 28.8 %   Family Post 30 %   Family % Change 4 %   GLOBAL Pre 26.03 %   GLOBAL Post 30 %   GLOBAL % Change 15 %      Personal Goals: Goals established at orientation with interventions provided to work toward goal.     Personal Goals and Risk Factors at Admission - 09/06/15 0854    Core Components/Risk Factors/Patient Goals on Admission   Sedentary Yes   Intervention (read-only) While in program, learn and follow the exercise prescription taught. Start at a low level workload and increase workload after able to maintain previous level for 30 minutes. Increase time before increasing intensity.  Was working out every day, working with  aerobic weight training.  HIgh intensity/low weights.  prior to heart attack.  Would like to return to this and more walking; at least 30 minutes a day.    Hypertension Yes   Goal Participant will see blood pressure controlled within the values of 140/6m/Hg or within value directed by their physician.   Intervention (read-only) Provide nutrition & aerobic exercise along with prescribed medications to achieve BP 140/90 or less.   Lipids Yes   Goal Cholesterol controlled with medications as prescribed, with individualized exercise RX and with personalized nutrition plan. Value goals: LDL < 740m HDL > 4056mParticipant states understanding of desired cholesterol values and following prescriptions.   Intervention (read-only) Provide nutrition & aerobic exercise along with prescribed medications to achieve LDL <67m62mDL >40mg8mStress Yes   Goal To meet with psychosocial counselor for stress and relaxation information and guidance. To state understanding of performing relaxation techniques and or identifying personal stressors.   Intervention (read-only) Provide education on  types of stress, identifiying stressors, and ways to cope with stress. Provide demonstration and active practice of relaxation techniques.  Stress at work. Has been lowered since returned to work.   JOb duties have changed to alleviate the major work stress.  This has made a huge difference for Kenyatta.    Take Less Medication Yes   Intervention Learn your risk factors and begin the lifestyle modifications for risk factor control during your time in the program.   Understand more about Heart/Pulmonary Disease. Yes   Intervention While in program utilize professionals for any questions, and attend the education sessions. Great websites to use are www.americanheart.org or www.lung.org for reliable information.       Personal Goals Discharge:     Goals and Risk Factor Review      09/14/15 1207 09/20/15 0659 10/12/15 1533 10/17/15 1512 10/31/15 1052   Core Components/Risk Factors/Patient Goals Review   Personal Goals Review Increase Aerobic Exercise and Physical Activity  Hypertension;Lipids;Stress     Review     Russelll said his MD told him his lipid blood work is now good. His blood pressure is under control. He cont. to take his medication, exercise and eat well.   Increase Aerobic Exercise and Physical Activity (read-only)   Goals Progress/Improvement seen  Yes Yes      Comments Kainon works out regularly at the [employer] and has a lot of experience with exercise. He has been cleared by his MD to lift more than 15 lbs. He exercises well in class and often states that it is very easy for him. We will follow up with him in the next few weeks with exercise challenges.  We have progressed Lavonta to help make the exercise more challenging. He is now doing jogging intervals on the TM and feels good about it. He has increased his dumbbell weight to 10lbs and we have given him extra challenges he can do during the weight training time. He continues to exercise on his own 3 days in addittion to class. We  will followup with him on how he is maintaining this next month.   Ryen is maintaining his jogging well on the TM and can now jog continuously for the entire 45 minute aerobic session. His goal is continuous jogging so we have removed the intervals and kept the speed at 6.69mh. RNidaldoes not want to use other modes of exercise at this time and wants to focus on his running. We will follow up with him on any new goals he would like to set for his final few weeks in the program.     Hypertension (read-only)   Progress seen toward goals   Yes     Comments   RHaracehas a very stable blood pressure.      Abnormal Lipids (read-only)   Progress seen towards goals   Unknown     Stress (read-only)   Progress seen towards goals   Yes     Comments   Exercise helps with stress.         Nutrition & Weight - Outcomes:     Pre Biometrics - 09/06/15 1033    Pre Biometrics   Height 6' 1.25" (1.861 m)   Weight 187 lb 11.2 oz (85.14 kg)   Waist Circumference 35 inches   Hip Circumference 40 inches   Waist to Hip Ratio 0.88 %   BMI (Calculated) 24.6       Nutrition:     Nutrition Therapy & Goals - 09/30/15 1141    Nutrition Therapy   Diet Reviewed heart healthy diet principles including DASH diet  principles.   Drug/Food Interactions Statins/Certain Fruits   Protein (oz) (read-only) 9 ounces/day   Fiber 30 grams   Whole Grain Foods 3 servings   Saturated Fats 14 max. grams   Fruits and Vegetables 8 servings/day   Personal Nutrition Goals   Personal Goal #1 Continue with present diet changes.   Personal Goal #2 Read labels for saturated fat, trans fat and sodium.   Comments Patient has made positive diet changes including decreasing added fats and sugar in his diet. He is more conscious of his sodium intake and has taken measures to decrease. Overall has a low intake of saturated fat.      Nutrition Discharge:     Nutrition Assessments - 10/27/15 1446    Rate Your Plate Scores    Pre Score 76.66   Pre Score % 84 %   Post Score 67   Post Score % 75 %   % Change -9 %      Education Questionnaire Score:     Knowledge Questionnaire Score - 10/27/15 1447    Knowledge Questionnaire Score   Pre Score 24   Post Score 26/28      Goals reviewed with patient; copy given to patient.

## 2015-10-31 NOTE — Progress Notes (Signed)
Daily Session Note  Patient Details  Name: George Jimenez MRN: [id number] Date of Birth: Jan 12, 1956 Referring Provider:  [name], MD  Encounter Date: 10/31/2015  Check In:     Session Check In - 10/31/15 0849    Check-In   Location [facility]   Staff Present Candiss Norse, MS, ACSM CEP, Exercise Physiologist;Barbar Brede Amedeo Plenty, BS, ACSM CEP, Exercise Physiologist;Carroll Enterkin, RN, BSN   Supervising physician immediately available to respond to emergencies See telemetry face sheet for immediately available ER MD   Medication changes reported     No   Fall or balance concerns reported    No   Warm-up and Cool-down Performed on first and last piece of equipment   Resistance Training Performed Yes   VAD Patient? No   Pain Assessment   Currently in Pain? No/denies   Multiple Pain Sites No           Exercise Prescription Changes - 10/31/15 0800    Exercise Review   Progression Yes   Response to Exercise   Symptoms None   Comments Calvyn has increased his running speed to 88mh/2%. He is tolerating his new intensity well and has had no signs or symptoms associated with this increase.   Frequency Add 3 additional days to program exercise sessions.  He is maintaining 3d/wk in addition to HT   Duration Progress to 50 minutes of aerobic without signs/symptoms of physical distress   Intensity Other (comment)  40-85% HRR ~112-154 bpm   Progression Continue progressive overload as per policy without signs/symptoms or physical distress.   Resistance Training   Training Prescription Yes   Weight 10   Reps 10-15   Interval Training   Interval Training Yes   Equipment Treadmill   Comments 6-780m   Treadmill   MPH 7   Grade 2   Minutes 45      Goals Met:  Independence with exercise equipment Exercise tolerated well Personal goals reviewed No report of cardiac concerns or symptoms Strength training completed today  Goals Unmet:  Not  Applicable  Goals Comments: Reviewed individualized exercise prescription and made increases per departmental policy. Exercise increases were discussed with the patient and they were able to perform the new work loads without issue (no signs or symptoms).     Dr. MaEmily Filberts Medical Director for [facility].

## 2015-10-31 NOTE — Patient Instructions (Signed)
Discharge Instructions  Patient Details  Name: George Jimenez MRN: [id number] Date of Birth: 23-Jul-1956 Referring Provider:  [name], MD   Number of Visits:   Reason for Discharge:  Patient reached a stable level of exercise. Patient independent in their exercise.  Smoking History:  History  Smoking status  . Never Smoker   Smokeless tobacco  . Not on file    Diagnosis:  S/P PTCA (percutaneous transluminal coronary angioplasty)  ST elevation myocardial infarction (STEMI), unspecified artery [facility])  Initial Exercise Prescription:     Initial Exercise Prescription - 09/06/15 1000    Date of Initial Exercise Prescription   Date 09/06/15   Treadmill   MPH 3   Grade 0   Minutes 10   Bike   Level 0.4   Minutes 15   Recumbant Bike   Level 4   RPM 45   Watts 30   Minutes 15   NuStep   Level 3   Watts 30   Minutes 15   Arm Ergometer   Level 1   Watts 8   Minutes 10   Arm/Foot Ergometer   Level 4   Watts 12   Minutes 10   Cybex   Level 3   RPM 50   Minutes 15   Recumbant Elliptical   Level 2   RPM 40   Watts 15   Minutes 15   Elliptical   Level 1   Speed 3   Minutes 1   REL-XR   Level 3   Watts 45   Minutes 15   T5 Nustep   Level 2   Watts 20   Minutes 15   Biostep-RELP   Level 3   Watts 20   Minutes 15   Prescription Details   Frequency (times per week) 3   Duration Progress to 30 minutes of continuous aerobic without signs/symptoms of physical distress   Intensity   THRR REST +  30   Ratings of Perceived Exertion 11-15   Progression Continue progressive overload as per policy without signs/symptoms or physical distress.   Resistance Training   Training Prescription Yes   Weight 3   Reps 10-15      Discharge Exercise Prescription (Final Exercise Prescription Changes):     Exercise Prescription Changes - 10/31/15 0800    Exercise Review   Progression Yes   Response to Exercise   Symptoms None   Comments Kaeden has  increased his running speed to 73mh/2%. He is tolerating his new intensity well and has had no signs or symptoms associated with this increase.   Frequency Add 3 additional days to program exercise sessions.  He is maintaining 3d/wk in addition to HT   Duration Progress to 50 minutes of aerobic without signs/symptoms of physical distress   Intensity Other (comment)  40-85% HRR ~112-154 bpm   Progression Continue progressive overload as per policy without signs/symptoms or physical distress.   Resistance Training   Training Prescription Yes   Weight 10   Reps 10-15   Interval Training   Interval Training Yes   Equipment Treadmill   Comments 6-763m   Treadmill   MPH 7   Grade 2   Minutes 45   Home Exercise Plan   [facility]  [facility]    6 Minute Walk      09/06/15 1034 10/31/15 0945  6 Minute Walk   Phase Initial Discharge    Distance 1743 feet 2000 feet    Distance % Change  15 %    Walk Time 6 minutes 6 minutes    # of Rest Breaks  0    RPE 13 6    Symptoms No     Resting HR 66 bpm 66 bpm    Resting BP 126/88 mmHg 124/82 mmHg    Max Ex. HR 87 bpm 107 bpm    Max Ex. BP 148/88 mmHg 150/80 mmHg       Quality of Life:     Quality of Life - 10/27/15 1447    Quality of Life Scores   Health/Function Pre 24.47 %   Health/Function Post 30 %   Health/Function % Change 23 %   Socioeconomic Pre 26.86 %   Socioeconomic Post 30 %   Socioeconomic % Change 12 %   Psych/Spiritual Pre 26.57 %   Psych/Spiritual Post 30 %   Psych/Spiritual % Change 13 %   Family Pre 28.8 %   Family Post 30 %   Family % Change 4 %   GLOBAL Pre 26.03 %   GLOBAL Post 30 %   GLOBAL % Change 15 %      Personal Goals: Goals established at orientation with interventions provided to work toward goal.     Personal Goals and Risk Factors at Admission - 09/06/15 0854    Core  Components/Risk Factors/Patient Goals on Admission   Sedentary Yes   Intervention (read-only) While in program, learn and follow the exercise prescription taught. Start at a low level workload and increase workload after able to maintain previous level for 30 minutes. Increase time before increasing intensity.  Was working out every day, working with  aerobic weight training.  HIgh intensity/low weights.  prior to heart attack.  Would like to return to this and more walking; at least 30 minutes a day.    Hypertension Yes   Goal Participant will see blood pressure controlled within the values of 140/79m/Hg or within value directed by their physician.   Intervention (read-only) Provide nutrition & aerobic exercise along with prescribed medications to achieve BP 140/90 or less.   Lipids Yes   Goal Cholesterol controlled with medications as prescribed, with individualized exercise RX and with personalized nutrition plan. Value goals: LDL < 740m HDL > 4062mParticipant states understanding of desired cholesterol values and following prescriptions.   Intervention (read-only) Provide nutrition & aerobic exercise along with prescribed medications to achieve LDL <72m72mDL >40mg20mStress Yes   Goal To meet with psychosocial counselor for stress and relaxation information and guidance. To state understanding of performing relaxation techniques and or identifying personal stressors.   Intervention (read-only) Provide education on types of stress, identifiying stressors, and ways to cope with stress. Provide demonstration and active practice of relaxation techniques.  Stress at work. Has been lowered since returned to work.   JOb duties have changed to alleviate the major work stress.  This has made a huge difference for Remmy.    Take Less Medication Yes   Intervention Learn your risk factors and begin the lifestyle modifications for risk factor control during your time in the program.   Understand more  about Heart/Pulmonary Disease. Yes   Intervention While in program utilize professionals for any questions, and attend the education sessions. Great websites to use are www.americanheart.org or www.lung.org for reliable information.       Personal Goals  Discharge:     Goals and Risk Factor Review - 10/31/15 1052    Core Components/Risk Factors/Patient Goals Review   Review Russelll said his MD told him his lipid blood work is now good. His blood pressure is under control. He cont. to take his medication, exercise and eat well.      Nutrition & Weight - Outcomes:     Pre Biometrics - 09/06/15 1033    Pre Biometrics   Height 6' 1.25" (1.861 m)   Weight 187 lb 11.2 oz (85.14 kg)   Waist Circumference 35 inches   Hip Circumference 40 inches   Waist to Hip Ratio 0.88 %   BMI (Calculated) 24.6       Nutrition:     Nutrition Therapy & Goals - 09/30/15 1141    Nutrition Therapy   Diet Reviewed heart healthy diet principles including DASH diet principles.   Drug/Food Interactions Statins/Certain Fruits   Protein (oz) (read-only) 9 ounces/day   Fiber 30 grams   Whole Grain Foods 3 servings   Saturated Fats 14 max. grams   Fruits and Vegetables 8 servings/day   Personal Nutrition Goals   Personal Goal #1 Continue with present diet changes.   Personal Goal #2 Read labels for saturated fat, trans fat and sodium.   Comments Patient has made positive diet changes including decreasing added fats and sugar in his diet. He is more conscious of his sodium intake and has taken measures to decrease. Overall has a low intake of saturated fat.      Nutrition Discharge:     Nutrition Assessments - 10/27/15 1446    Rate Your Plate Scores   Pre Score 76.66   Pre Score % 84 %   Post Score 67   Post Score % 75 %   % Change -9 %      Education Questionnaire Score:     Knowledge Questionnaire Score - 10/27/15 1447    Knowledge Questionnaire Score   Pre Score 24   Post Score 26/28       Goals reviewed with patient; copy given to patient.

## 2015-11-02 ENCOUNTER — Encounter: Payer: BLUE CROSS/BLUE SHIELD | Attending: Cardiovascular Disease | Admitting: *Deleted

## 2015-11-02 DIAGNOSIS — I213 ST elevation (STEMI) myocardial infarction of unspecified site: Secondary | ICD-10-CM | POA: Diagnosis present

## 2015-11-02 DIAGNOSIS — Z9861 Coronary angioplasty status: Secondary | ICD-10-CM

## 2015-11-02 NOTE — Progress Notes (Signed)
Daily Session Note  Patient Details  Name: George Jimenez MRN: [id number] Date of Birth: 28-Jul-1956 Referring Provider:  [name], MD  Encounter Date: 11/02/2015  Check In:     Session Check In - 11/02/15 0840    Check-In   Staff Present Nyoka Cowden, RN;Pace Lamadrid, RN, BSN, CCRP;Steven Way, BS, ACSM EP-C, Exercise Physiologist   Supervising physician immediately available to respond to emergencies See telemetry face sheet for immediately available ER MD   Medication changes reported     No   Fall or balance concerns reported    No   Warm-up and Cool-down Performed on first and last piece of equipment   Resistance Training Performed No   VAD Patient? No   Pain Assessment   Currently in Pain? No/denies         Goals Met:  Independence with exercise equipment Exercise tolerated well Personal goals reviewed No report of cardiac concerns or symptoms  Goals Unmet:  Not Applicable  Comments: Doing well with exercise prescription progression.   Discharged today   [name] is Medical Director for [facility].

## 2015-11-02 NOTE — Progress Notes (Signed)
Discharge Summary  Patient Details  Name: George Jimenez MRN: [id number] Date of Birth: 1956-07-31 Referring Provider:  [name], MD   Number of Visits: 94   Reason for Discharge:  Patient reached a stable level of exercise. Patient independent in their exercise.  Smoking History:  History  Smoking status  . Never Smoker   Smokeless tobacco  . Not on file    Diagnosis:  S/P PTCA (percutaneous transluminal coronary angioplasty) - Plan: CARDIAC REHAB 30 DAY REVIEW  ST elevation myocardial infarction (STEMI), unspecified artery ([place]) - Plan: CARDIAC REHAB 30 DAY REVIEW  ADL [facility]:   Initial Exercise Prescription:     Initial Exercise Prescription - 09/06/15 1000    Date of Initial Exercise Prescription   Date 09/06/15   Treadmill   MPH 3   Grade 0   Minutes 10   Bike   Level 0.4   Minutes 15   Recumbant Bike   Level 4   RPM 45   Watts 30   Minutes 15   NuStep   Level 3   Watts 30   Minutes 15   Arm Ergometer   Level 1   Watts 8   Minutes 10   Arm/Foot Ergometer   Level 4   Watts 12   Minutes 10   Cybex   Level 3   RPM 50   Minutes 15   Recumbant Elliptical   Level 2   RPM 40   Watts 15   Minutes 15   Elliptical   Level 1   Speed 3   Minutes 1   REL-XR   Level 3   Watts 45   Minutes 15   T5 Nustep   Level 2   Watts 20   Minutes 15   Biostep-RELP   Level 3   Watts 20   Minutes 15   Prescription Details   Frequency (times per week) 3   Duration Progress to 30 minutes of continuous aerobic without signs/symptoms of physical distress   Intensity   THRR REST +  30   Ratings of Perceived Exertion 11-15   Progression   Progression Continue progressive overload as per policy without signs/symptoms or physical distress.   Resistance Training   Training Prescription Yes   Weight 3   Reps 10-15      Discharge Exercise Prescription (Final Exercise Prescription Changes):     Exercise Prescription Changes - 10/31/15 0800     Exercise Review   Progression Yes   Response to Exercise   Symptoms None   Comments Clois has increased his running speed to 44mh/2%. He is tolerating his new intensity well and has had no signs or symptoms associated with this increase.   Frequency Add 3 additional days to program exercise sessions.  He is maintaining 3d/wk in addition to HT   Duration Progress to 50 minutes of aerobic without signs/symptoms of physical distress   Intensity Other (comment)  40-85% HRR ~112-154 bpm   Progression   Progression Continue progressive overload as per policy without signs/symptoms or physical distress.   Resistance Training   Training Prescription (read-only) Yes   Weight (read-only) 10   Reps (read-only) 10-15   Interval Training   Interval Training Yes   Equipment Treadmill   Comments 6-734m   Treadmill   MPH (read-only) 7   Grade (read-only) 2   Minutes (read-only) 45[facility]  TM  at home and weight training at [place]:     6 Minute Walk      09/06/15 1034 10/31/15 0945     6 Minute Walk   Phase Initial Discharge    Distance 1743 feet 2000 feet    Distance % Change  15 %    Walk Time 6 minutes 6 minutes    # of Rest Breaks  0    RPE 13 6    Symptoms No     Resting HR 66 bpm 66 bpm    Resting BP 126/88 mmHg 124/82 mmHg    Max Ex. HR 87 bpm 107 bpm    Max Ex. BP 148/88 mmHg 150/80 mmHg       Psychological, QOL, Others - Outcomes: PHQ 2/9: Depression screen [facility] 2/9 10/27/2015 09/06/2015  Decreased Interest 0 0  Down, Depressed, Hopeless 0 0  PHQ - 2 Score 0 0  Altered sleeping 0 0  Tired, decreased energy 0 0  Change in appetite 0 0  Feeling bad or failure about yourself  0 0  Trouble concentrating 0 0  Moving slowly or fidgety/restless 0 0  Suicidal thoughts 0 0  PHQ-9 Score 0 0  Difficult doing work/chores Not difficult at all Not difficult at all     Quality of Life:     Quality of Life - 10/27/15 1447    Quality of Life Scores   Health/Function Pre 24.47 %   Health/Function Post 30 %   Health/Function % Change 23 %   Socioeconomic Pre 26.86 %   Socioeconomic Post 30 %   Socioeconomic % Change 12 %   Psych/Spiritual Pre 26.57 %   Psych/Spiritual Post 30 %   Psych/Spiritual % Change 13 %   Family Pre 28.8 %   Family Post 30 %   Family % Change 4 %   GLOBAL Pre 26.03 %   GLOBAL Post 30 %   GLOBAL % Change 15 %      Personal Goals: Goals established at orientation with interventions provided to work toward goal.     Personal Goals and Risk Factors at Admission - 09/06/15 0854    Core Components/Risk Factors/Patient Goals on Admission   Sedentary Yes   Intervention (read-only) While in program, learn and follow the exercise prescription taught. Start at a low level workload and increase workload after able to maintain previous level for 30 minutes. Increase time before increasing intensity.  Was working out every day, working with  aerobic weight training.  HIgh intensity/low weights.  prior to heart attack.  Would like to return to this and more walking; at least 30 minutes a day.    Hypertension Yes   Goal Participant will see blood pressure controlled within the values of 140/39m/Hg or within value directed by their physician.   Intervention (read-only) Provide nutrition & aerobic exercise along with prescribed medications to achieve BP 140/90 or less.   Lipids Yes   Goal Cholesterol controlled with medications as prescribed, with individualized exercise RX and with personalized nutrition plan. Value goals: LDL < 743m HDL > 4014mParticipant states understanding of desired cholesterol values and following prescriptions.   Intervention (read-only) Provide nutrition & aerobic exercise along with prescribed medications to achieve LDL <65m44mDL >40mg45mStress Yes   Goal To meet with psychosocial counselor for stress  and relaxation information and guidance. To state understanding of performing relaxation techniques and or identifying personal stressors.  Intervention (read-only) Provide education on types of stress, identifiying stressors, and ways to cope with stress. Provide demonstration and active practice of relaxation techniques.  Stress at work. Has been lowered since returned to work.   JOb duties have changed to alleviate the major work stress.  This has made a huge difference for Kue.    Take Less Medication Yes   Intervention Learn your risk factors and begin the lifestyle modifications for risk factor control during your time in the program.   Understand more about Heart/Pulmonary Disease. Yes   Intervention While in program utilize professionals for any questions, and attend the education sessions. Great websites to use are www.americanheart.org or www.lung.org for reliable information.       Personal Goals Discharge:     Goals and Risk Factor Review      09/14/15 1207 09/20/15 0659 10/12/15 1533 10/17/15 1512 10/31/15 1052   Core Components/Risk Factors/Patient Goals Review   Personal Goals Review Increase Aerobic Exercise and Physical Activity  Hypertension;Lipids;Stress     Review     Russelll said his MD told him his lipid blood work is now good. His blood pressure is under control. He cont. to take his medication, exercise and eat well.   Increase Aerobic Exercise and Physical Activity (read-only)   Goals Progress/Improvement seen  Yes Yes      Comments Jenny works out regularly at the [employer] and has a lot of experience with exercise. He has been cleared by his MD to lift more than 15 lbs. He exercises well in class and often states that it is very easy for him. We will follow up with him in the next few weeks with exercise challenges.  We have progressed [place]ward to help make the exercise more challenging. He is now doing jogging intervals on the TM and feels good about it. He has  increased his dumbbell weight to 10lbs and we have given him extra challenges he can do during the weight training time. He continues to exercise on his own 3 days in addittion to class. We will followup with him on how he is maintaining this next month.   Eion is maintaining his jogging well on the TM and can now jog continuously for the entire 45 minute aerobic session. His goal is continuous jogging so we have removed the intervals and kept the speed at 6.3mh. RJamarkusdoes not want to use other modes of exercise at this time and wants to focus on his running. We will follow up with him on any new goals he would like to set for his final few weeks in the program.     Hypertension (read-only)   Progress seen toward goals   Yes     Comments   RSydhas a very stable blood pressure.      Abnormal Lipids (read-only)   Progress seen towards goals   Unknown     Stress (read-only)   Progress seen towards goals   Yes     Comments   Exercise helps with stress.         Nutrition & Weight - Outcomes:     Pre Biometrics - 09/06/15 1033    Pre Biometrics   Height 6' 1.25" (1.861 m)   Weight 187 lb 11.2 oz (85.14 kg)   Waist Circumference 35 inches   Hip Circumference 40 inches   Waist to Hip Ratio 0.88 %   BMI (Calculated) 24.6       Nutrition:  Nutrition Therapy & Goals - 09/30/15 1141    Nutrition Therapy   Diet Reviewed heart healthy diet principles including DASH diet principles.   Drug/Food Interactions Statins/Certain Fruits   Protein (oz) (read-only) 9 ounces/day   Fiber 30 grams   Whole Grain Foods 3 servings   Saturated Fats 14 max. grams   Fruits and Vegetables 8 servings/day   Personal Nutrition Goals   Personal Goal #1 Continue with present diet changes.   Personal Goal #2 Read labels for saturated fat, trans fat and sodium.   Comments Patient has made positive diet changes including decreasing added fats and sugar in his diet. He is more conscious of his sodium  intake and has taken measures to decrease. Overall has a low intake of saturated fat.      Nutrition Discharge:     Nutrition Assessments - 10/27/15 1446    Rate Your Plate Scores   Pre Score 76.66   Pre Score % 84 %   Post Score 67   Post Score % 75 %   % Change -9 %      Education Questionnaire Score:     Knowledge Questionnaire Score - 10/27/15 1447    Knowledge Questionnaire Score   Pre Score 24   Post Score 26/28      Goals reviewed with patient; copy given to patient.

## 2015-11-02 NOTE — Addendum Note (Signed)
Addended by: Rudy Jew on: 11/02/2015 06:53 AM   Modules accepted: Orders

## 2015-11-08 ENCOUNTER — Telehealth: Payer: Self-pay | Admitting: Cardiovascular Disease

## 2015-11-08 NOTE — Telephone Encounter (Signed)
New message     Pt is due to have an echo soon.  He thought he was having an echo and lab work and see Dr Tresa EndoKelly 1 week later.  Please call and let him know if he needs to have labs and why wait until June to see Dr Tresa EndoKelly

## 2015-11-08 NOTE — Telephone Encounter (Signed)
Spoke with pt, Follow up scheduled with dr Tresa Endokelly after echo. Questions regarding labs and location of appt answered.

## 2015-11-24 ENCOUNTER — Other Ambulatory Visit (HOSPITAL_COMMUNITY): Payer: BLUE CROSS/BLUE SHIELD

## 2015-11-30 LAB — COMPREHENSIVE METABOLIC PANEL WITH GFR
ALT: 28 U/L (ref 9–46)
AST: 27 U/L (ref 10–35)
Albumin: 4 g/dL (ref 3.6–5.1)
Alkaline Phosphatase: 67 U/L (ref 40–115)
BUN: 21 mg/dL (ref 7–25)
CO2: 26 mmol/L (ref 20–31)
Calcium: 8.9 mg/dL (ref 8.6–10.3)
Chloride: 105 mmol/L (ref 98–110)
Creat: 1.07 mg/dL (ref 0.70–1.33)
Glucose, Bld: 87 mg/dL (ref 65–99)
Potassium: 4.5 mmol/L (ref 3.5–5.3)
Sodium: 139 mmol/L (ref 135–146)
Total Bilirubin: 0.8 mg/dL (ref 0.2–1.2)
Total Protein: 6.9 g/dL (ref 6.1–8.1)

## 2015-11-30 LAB — LIPID PANEL
Cholesterol: 97 mg/dL — ABNORMAL LOW (ref 125–200)
HDL: 36 mg/dL — ABNORMAL LOW
LDL Cholesterol: 47 mg/dL
Total CHOL/HDL Ratio: 2.7 ratio
Triglycerides: 70 mg/dL
VLDL: 14 mg/dL

## 2015-12-02 ENCOUNTER — Telehealth: Payer: Self-pay | Admitting: *Deleted

## 2015-12-02 NOTE — Telephone Encounter (Signed)
-----   Message from Darrol Jumphonda G Barrett, PA-C sent at 11/29/2015  5:39 PM EDT ----- Please let him know that the complete metabolic panel is fine, but his good cholesterol is too low and his triglycerides are too high. He mentioned that he likes sweets, make sure he is not eating too many of them. Keep follow-up appointment with Dr. Tresa EndoKelly. Thanks

## 2015-12-02 NOTE — Telephone Encounter (Signed)
Spoke with patient. Gave him lab results and recommendations per Theodore Demarkhonda Barrett.

## 2015-12-16 ENCOUNTER — Ambulatory Visit (INDEPENDENT_AMBULATORY_CARE_PROVIDER_SITE_OTHER): Payer: BLUE CROSS/BLUE SHIELD | Admitting: Cardiovascular Disease

## 2015-12-16 VITALS — BP 134/88 | HR 61 | Ht 72.0 in | Wt 180.0 lb

## 2015-12-16 DIAGNOSIS — K4090 Unilateral inguinal hernia, without obstruction or gangrene, not specified as recurrent: Secondary | ICD-10-CM | POA: Diagnosis not present

## 2015-12-16 DIAGNOSIS — I10 Essential (primary) hypertension: Secondary | ICD-10-CM | POA: Diagnosis not present

## 2015-12-16 DIAGNOSIS — I2102 ST elevation (STEMI) myocardial infarction involving left anterior descending coronary artery: Secondary | ICD-10-CM

## 2015-12-16 DIAGNOSIS — K4020 Bilateral inguinal hernia, without obstruction or gangrene, not specified as recurrent: Secondary | ICD-10-CM

## 2015-12-16 NOTE — Patient Instructions (Signed)
Your physician recommends that you schedule a follow-up appointment in: June after Stress Test  Your physician has requested that you have en exercise stress myoview. For further information please visit [email or web address]. Please follow instruction sheet, as given.  You have been referred to Dr Ezzard StandingNewman

## 2015-12-19 ENCOUNTER — Encounter: Payer: Self-pay | Admitting: Cardiovascular Disease

## 2015-12-19 DIAGNOSIS — K4020 Bilateral inguinal hernia, without obstruction or gangrene, not specified as recurrent: Secondary | ICD-10-CM | POA: Insufficient documentation

## 2015-12-19 NOTE — Progress Notes (Signed)
Patient ID: George Jimenez, male   DOB: 1956/01/29, 60 y.o.   MRN: [id number]        HPI: George Jimenez is a 60 y.o. male who presents to the office today for a 4 month follow up cardiology evaluation following his ST segment elevation myocardial infarction.  George Jimenez developed new onset chest discomfort while working out on 08/17/2015 and presented to [facility] emergency room.  His initial ECG was unremarkable, but he then developed profound hyperacute T waves with inferolateral ST depression in ST elevation anterolaterally.  A code STEMI was activated and he was transported to [facility] where he underwent emergent cardiac catheterization by me.  He was found to have mild acute LV dysfunction with an EF 45-50% and mild mid anterolateral hypocontractility.  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  His circumflex vessel was normal and ithere was smooth 20% proximal RCA narrowing of 60-70% mid stenosis between areas of ectasia.  He underwent successful PCI with DES stenting to the proximal LAD with ultimate insertion of a 3.522 mm Resolute DES stent postdilated 3.73 mm.  The 99% stenosis was reduced to 0%.  An ejection fraction done the day following his acute intervention revealed normalization of LV function with an ejection fraction of 60-65%.  There was mild residual hypokinesis of the anteroseptal wall.  There was grade 1 diastolic dysfunction.  Subsequently, he has done well.  He participated in the cardiac rehabilitation program.  He is now back to running and is running up to 4 miles and 32 minutes.  He denies any recurrent chest pain symptoms.  He denies palpitations.  He does have some issues with varicose veins.  He also has bilateral inguinal hernias and  recently his left inguinal hernia is enlarging and bulging more with coughing spells.   Past Medical History  Diagnosis Date  . HTN (hypertension)   . Diverticulitis   . Dyslipidemia     low HDL  . STEMI (ST elevation myocardial infarction) ([place]) 08/17/2015    DES LAD    Past Surgical History  Procedure Laterality Date  . Appendectomy    . Cardiac catheterization N/A 08/17/2015    Procedure: Left Heart Cath and Coronary Angiography;  Surgeon: [name], MD; LAD 99 & 40%, RCA 40%, EF 45-50%   . Cardiac catheterization Right 08/17/2015    Procedure: Coronary Stent Intervention;  Surgeon: [name], MD; mLAD 99%>0 w/ 3.5 x 22 mm Resolute DES; Laterality: Right     Allergies  Allergen Reactions  . Ace Inhibitors Cough  . Levaquin [Levofloxacin In D5w]     Current Outpatient Prescriptions  Medication Sig Dispense Refill  . aspirin EC 81 MG tablet Take 81 mg by mouth daily.    Marland Kitchen atorvastatin (LIPITOR) 80 MG tablet Take 1 tablet (80 mg total) by mouth daily at 6 PM. (Patient taking differently: Take 40 mg by mouth daily at 6 PM. ) 30 tablet 11  . carvedilol (COREG) 3.125 MG tablet Take 1 tablet (3.125 mg total) by mouth 2 (two) times daily with a meal. 30 tablet 11  . losartan (COZAAR) 50 MG tablet Take 50 mg by mouth daily.  12  . Multiple Vitamin (MULTIVITAMIN WITH MINERALS) TABS tablet Take 1 tablet by mouth  daily. Reported on 09/06/2015    . nitroGLYCERIN (NITROSTAT) 0.4 MG SL tablet Place 1 tablet (0.4 mg total) under the tongue every 5 (five) minutes as needed for chest pain. 25 tablet 12  . selenium sulfide (SELSUN) 2.5 % shampoo Apply 1 application topically daily.  3  . ticagrelor (BRILINTA) 90 MG TABS tablet Take 1 tablet (90 mg total) by mouth 2 (two) times daily. 60 tablet 6   No current facility-administered medications for this visit.    Social History   Social History  . Marital Status: Married    Spouse Name: N/A  . Number of Children: N/A  . Years of  Education: N/A   Occupational History  . Not on file.   Social History Main Topics  . Smoking status: Never Smoker   . Smokeless tobacco: Not on file  . Alcohol Use: No  . Drug Use: Not on file  . Sexual Activity: Not on file   Other Topics Concern  . 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  Family History  Problem Relation Age of Onset  . Heart attack Father     CABG in 66s    ROS General: Negative; No fevers, chills, or night sweats HEENT: Negative; No changes in vision or hearing, sinus congestion, difficulty swallowing Pulmonary: Negative; No cough, wheezing, shortness of breath, hemoptysis Cardiovascular: See HPI: No chest pain, presyncope, syncope, palpatations History of varicose veins GI: Negative; No nausea, vomiting, diarrhea, or abdominal pain GU: Bilateral inguinal hernia Musculoskeletal: Negative; no myalgias, joint pain, or weakness Hematologic: Negative; no easy bruising, bleeding Endocrine: Negative; no heat/cold intolerance; no diabetes, Neuro: Negative; no changes in balance, headaches Skin: Negative; No rashes or skin lesions Psychiatric: Negative; No behavioral problems, depression Sleep: Negative; No snoring,  daytime sleepiness, hypersomnolence, bruxism, restless legs, hypnogognic hallucinations. Other comprehensive 14 point system review is negative   Physical Exam BP 134/88 mmHg  Pulse 61  Ht 6' (1.829 m)  Wt 180 lb (81.647 kg)  BMI 24.41 kg/m2 Wt Readings from Last 3 Encounters:  12/16/15 180 lb (81.647 kg)  09/06/15 187 lb 11.2 oz (85.14 kg)  08/25/15 190 lb 8 oz (86.41 kg)   General: Alert, oriented, no distress.  Skin: normal turgor, no rashes, warm and dry HEENT: Normocephalic, atraumatic. Pupils equal round and reactive to light; sclera anicteric; extraocular muscles intact, No lid lag; Nose without nasal septal hypertrophy;  Mouth/Parynx benign; Mallinpatti scale 2 Neck: No JVD, no carotid bruits; normal carotid upstroke Lungs: clear to ausculatation and percussion bilaterally; no wheezing or rales, normal inspiratory and expiratory effort Chest wall: without tenderness to palpitation Heart: PMI not displaced, RRR, s1 s2 normal, 1/6 systolic murmur, No diastolic murmur, no rubs, gallops, thrills, or heaves Abdomen: Bilateral inguinal hernias with a larger hernia sac on the left compared to the right; soft, nontender; no hepatosplenomehaly, BS+; abdominal aorta nontender and not dilated by palpation. Back: no CVA tenderness Pulses: 2+  Musculoskeletal: full range of motion, normal strength, no joint deformities Extremities: Pulses 2+, no clubbing cyanosis or edema, Homan's sign negative  Neurologic: grossly nonfocal; Cranial nerves grossly wnl Psychologic: Normal mood and affect   ECG (independently read by me): Normal sinus rhythm at 61 bpm.  No ECG evidence for his recent myocardial infarction.  Nondiagnostic T-wave in lead aVL.  LABS:  BMP Latest Ref Rng 11/25/2015 08/18/2015 08/17/2015  Glucose 65 -  99 mg/dL 87 169(H) 138(H)  BUN 7 - 25 mg/dL 21 12 18   Creatinine 0.70 - 1.33 mg/dL 1.07 1.02 1.21  Sodium 135 - 146 mmol/L 139 137 137  Potassium 3.5 - 5.3 mmol/L 4.5 3.7 3.8  Chloride 98 - 110 mmol/L 105 108 103  CO2 20 - 31 mmol/L 26 20(L) 25  Calcium 8.6 - 10.3 mg/dL 8.9 8.3(L) 9.1     Hepatic Function Latest Ref Rng 11/25/2015  Total Protein 6.1 - 8.1 g/dL 6.9  Albumin 3.6 - 5.1 g/dL 4.0  AST 10 - 35 U/L 27  ALT 9 - 46 U/L 28  Alk Phosphatase 40 - 115 U/L 67  Total Bilirubin 0.2 - 1.2 mg/dL 0.8    CBC Latest Ref Rng 08/18/2015 08/17/2015  WBC 4.0 - 10.5 K/uL 5.7 8.0  Hemoglobin 13.0 - 17.0 g/dL 11.8(L) 13.8  Hematocrit 39.0 - 52.0 % 34.5(L) 40.2  Platelets 150 - 400 K/uL 100(L) 185   Lab Results  Component Value Date   MCV 86.7 08/18/2015   MCV 86.5 08/17/2015    No results found for:  TSH  BNP No results found for: BNP  ProBNP No results found for: PROBNP   Lipid Panel     Component Value Date/Time   CHOL 97* 11/25/2015 0939   TRIG 70 11/25/2015 0939   HDL 36* 11/25/2015 0939   CHOLHDL 2.7 11/25/2015 0939   VLDL 14 11/25/2015 0939   LDLCALC 47 11/25/2015 0939     RADIOLOGY: No results found.    ASSESSMENT AND PLAN: George Jimenez is a 60 year old active gentleman who suffered an acute coronary syndrome on 08/17/2015.  An emergent catheterization revealed a 99% proximal LAD stenosis for which he underwent successful stenting with a DES stent.  He also was found to have 65% stenosis in his mid RCA for which medical therapy was recommended.  He has been documented have complete normalization of LV function.  He continues to be symptom free on his current medical regimen consisting of carvedilol at low dose 3.125 twice a day, losartan 50 mg daily.  He is on atorvastatin 80 mg and is on dual antiplatelet therapy with aspirin and Brilinta 90 mg twice a day.  His most recent lipid panel from 11/25/2015 showed an LDL cholesterol at 47 and total cholesterol 97.  I've suggested that he can try to reduce his atorvastatin to 40 mg.  His ECG is normal.  He has lost approximately 18 pounds since suffering his myocardial infarction.  Prior to his MI he was planning to have a procedure done for his varicose veins but these have been deferred.  He also has bilateral inguinal hernias and his left side is becoming more prominent and bulging more particularly with increasing cough episodes.  Ideally, I discussed with him optimal strategy is to continue DES therapy for minimum of a year following an acute coronary syndrome.  However, if his hernia is continuing to expand this may be associated with increased risk.  As result, I am scheduling him for a nuclear perfusion stress test in June, 6 months following his MI for preoperative screening.  I am scheduling him to see Dr. [name]  for surgical consultation.  If his nuclear study is stable and he continues to be asymptomatic, his George Jimenez can be held for at least 5 days after 6 months following his MI and surgery can be performed in July if clinically indicated to reduce risk of potential future strangulation.  I will see  him in June in follow-up of his exercise Myoview study for further evaluation and recommendations.   [name], MD, [facility] 12/19/2015 5:45 PM

## 2015-12-29 ENCOUNTER — Other Ambulatory Visit: Payer: Self-pay | Admitting: Orthopedic Surgery

## 2015-12-29 DIAGNOSIS — S46911A Strain of unspecified muscle, fascia and tendon at shoulder and upper arm level, right arm, initial encounter: Secondary | ICD-10-CM

## 2016-01-05 ENCOUNTER — Telehealth (HOSPITAL_COMMUNITY): Payer: Self-pay | Admitting: Radiology

## 2016-01-05 NOTE — Telephone Encounter (Signed)
Spoke with patient. He is not sure if Dr. Tresa EndoKelly still wants the echocardiogram. I informed the patient I will contact Dr. Tresa EndoKelly and call him back.

## 2016-01-07 NOTE — Telephone Encounter (Signed)
No need for echo but to have a nuclear stress test in June

## 2016-01-11 ENCOUNTER — Ambulatory Visit: Payer: BLUE CROSS/BLUE SHIELD

## 2016-02-02 ENCOUNTER — Telehealth (HOSPITAL_COMMUNITY): Payer: Self-pay

## 2016-02-02 NOTE — Telephone Encounter (Signed)
Encounter complete. 

## 2016-02-07 ENCOUNTER — Ambulatory Visit (HOSPITAL_COMMUNITY)
Admission: RE | Admit: 2016-02-07 | Discharge: 2016-02-07 | Disposition: A | Discharge status: Home / Self Care | Payer: BLUE CROSS/BLUE SHIELD | Source: Ambulatory Visit | Attending: Cardiovascular Disease | Admitting: Cardiovascular Disease

## 2016-02-07 DIAGNOSIS — I2102 ST elevation (STEMI) myocardial infarction involving left anterior descending coronary artery: Secondary | ICD-10-CM | POA: Diagnosis not present

## 2016-02-07 DIAGNOSIS — I10 Essential (primary) hypertension: Secondary | ICD-10-CM | POA: Insufficient documentation

## 2016-02-07 DIAGNOSIS — R9439 Abnormal result of other cardiovascular function study: Secondary | ICD-10-CM | POA: Diagnosis not present

## 2016-02-07 DIAGNOSIS — Z8249 Family history of ischemic heart disease and other diseases of the circulatory system: Secondary | ICD-10-CM | POA: Diagnosis not present

## 2016-02-07 MED ORDER — TECHNETIUM TC 99M TETROFOSMIN IV KIT
10.6000 | PACK | Freq: Once | INTRAVENOUS | Status: AC | PRN
  Administered 2016-02-07: 10.6 via INTRAVENOUS
  Filled 2016-02-07: qty 11

## 2016-02-07 MED ORDER — TECHNETIUM TC 99M TETROFOSMIN IV KIT
31.9000 | PACK | Freq: Once | INTRAVENOUS | Status: AC | PRN
  Administered 2016-02-07: 31.9 via INTRAVENOUS
  Filled 2016-02-07: qty 32

## 2016-02-08 LAB — MYOCARDIAL PERFUSION IMAGING
CHL CUP RESTING HR STRESS: 58 {beats}/min
CHL RATE OF PERCEIVED EXERTION: 16
CSEPEW: 17.2 METS
CSEPPHR: 150 {beats}/min
Exercise duration (min): 14 min
LVDIAVOL: 119 mL (ref 62–150)
LVSYSVOL: 75 mL
MPHR: 162 {beats}/min
Percent HR: 92 %
SDS: 0
SRS: 1
SSS: 1
TID: 1.01

## 2016-02-13 ENCOUNTER — Encounter: Payer: Self-pay | Admitting: Cardiology

## 2016-02-13 ENCOUNTER — Ambulatory Visit (INDEPENDENT_AMBULATORY_CARE_PROVIDER_SITE_OTHER): Payer: BLUE CROSS/BLUE SHIELD | Admitting: Cardiology

## 2016-02-13 VITALS — BP 116/75 | HR 66 | Ht 72.0 in | Wt 177.4 lb

## 2016-02-13 DIAGNOSIS — I2510 Atherosclerotic heart disease of native coronary artery without angina pectoris: Secondary | ICD-10-CM

## 2016-02-13 DIAGNOSIS — K4021 Bilateral inguinal hernia, without obstruction or gangrene, recurrent: Secondary | ICD-10-CM

## 2016-02-13 DIAGNOSIS — Z01810 Encounter for preprocedural cardiovascular examination: Secondary | ICD-10-CM | POA: Diagnosis not present

## 2016-02-13 DIAGNOSIS — Z9861 Coronary angioplasty status: Secondary | ICD-10-CM

## 2016-02-13 DIAGNOSIS — I252 Old myocardial infarction: Secondary | ICD-10-CM

## 2016-02-13 DIAGNOSIS — I10 Essential (primary) hypertension: Secondary | ICD-10-CM | POA: Diagnosis not present

## 2016-02-13 DIAGNOSIS — E785 Hyperlipidemia, unspecified: Secondary | ICD-10-CM

## 2016-02-13 NOTE — Assessment & Plan Note (Signed)
F/U Myoview pre hernia repair

## 2016-02-13 NOTE — Assessment & Plan Note (Signed)
Cleared for hernia repair- Myoview low risk.  OK to hold Brilinta 5 days pre op, resume ASAP post op.

## 2016-02-13 NOTE — Assessment & Plan Note (Signed)
Controlled, ACE intol-cough

## 2016-02-13 NOTE — Assessment & Plan Note (Signed)
LDL 47 March 2017

## 2016-02-13 NOTE — Progress Notes (Signed)
02/13/2016 George Jimenez   07/10/1956  [id number]  Primary Physician George PentonMark F Miller, George Jimenez Primary Cardiologist: Dr Tresa EndoKelly  HPI:  60 y/o male followed by Dr Tresa EndoKelly who had an anterior MI while running Aug 17 2015. He was [facility] as a [facility]. He had an LAD DES placed. His initial EF was 45% but post MI echo showed his EF to be 60%. He has bilateral inguinal hernias and Dr Tresa EndoKelly felt he could stop his Brilinta for this if needed after 6 months of therapy, and if the pt's Myoview was low risk. He is in the office today for f/u after his Myoview. He denies chest pain, he is back to running 4 miles a day. His Myoview was low risk.    Current Outpatient Prescriptions  Medication Sig Dispense Refill  . aspirin EC 81 MG tablet Take 81 mg by mouth daily.    Marland Kitchen. atorvastatin (LIPITOR) 80 MG tablet Take 1 tablet (80 mg total) by mouth daily at 6 PM. (Patient taking differently: Take 40 mg by mouth daily at 6 PM. ) 30 tablet 11  . carvedilol (COREG) 3.125 MG tablet Take 1 tablet (3.125 mg total) by mouth 2 (two) times daily with a meal. 30 tablet 11  . losartan (COZAAR) 50 MG tablet Take 50 mg by mouth daily.  12  . Multiple Vitamin (MULTIVITAMIN WITH MINERALS) TABS tablet Take 1 tablet by mouth daily. Reported on 09/06/2015    . nitroGLYCERIN (NITROSTAT) 0.4 MG SL tablet Place 1 tablet (0.4 mg total) under the tongue every 5 (five) minutes as needed for chest pain. 25 tablet 12  . selenium sulfide (SELSUN) 2.5 % shampoo Apply 1 application topically daily.  3  . ticagrelor (BRILINTA) 90 MG TABS tablet Take 1 tablet (90 mg total) by mouth 2 (two) times daily. 60 tablet 6   No current facility-administered medications for this visit.    Allergies  Allergen Reactions  . Ace Inhibitors Cough  . Levaquin [Levofloxacin In D5w]     Social History   Social History  . Marital Status: Married    Spouse Name: N/A  . Number of Children: N/A  . Years of Education: N/A   Occupational History  . Not  on file.   Social History Main Topics  . Smoking status: Never Smoker   . Smokeless tobacco: Not on file  . Alcohol Use: No  . Drug Use: Not on file  . Sexual Activity: Not on file   Other Topics Concern  . Not on file   Social History Narrative     Review of Systems: General: negative for chills, fever, night sweats or weight changes.  Cardiovascular: negative for chest pain, dyspnea on exertion, edema, orthopnea, palpitations, paroxysmal nocturnal dyspnea or shortness of breath Dermatological: negative for rash Respiratory: negative for cough or wheezing Urologic: negative for hematuria Abdominal: negative for nausea, vomiting, diarrhea, bright red blood per rectum, melena, or hematemesis Neurologic: negative for visual changes, syncope, or dizziness All other systems reviewed and are otherwise negative except as noted above.    Blood pressure 116/75, pulse 66, height 6' (1.829 m), weight 177 lb 6 oz (80.457 kg).  General appearance: alert, cooperative and no distress Lungs: clear to auscultation bilaterally Heart: regular rate and rhythm Extremities: no edema Neurologic: Grossly normal   ASSESSMENT AND PLAN:   HTN (hypertension) Controlled, ACE intol-cough  Dyslipidemia LDL 47 March 2017  Inguinal hernia bilateral, non-recurrent Cleared for hernia repair- Myoview low risk.  OK to hold Brilinta 5 days pre op, resume ASAP post op.   Pre-operative cardiovascular examination F/U Myoview pre hernia repair    PLAN  OK to proceed with hernia repair from a cardiovascular standpoint. OK to stop Brilinta 5 days pre op, resume ASAP post op. We will be available for any cardiac issues. F/U Dr Tresa Endo in 6 months.   Corine Shelter PA-C 02/13/2016 9:28 AM

## 2016-02-13 NOTE — Patient Instructions (Signed)
Your physician recommends that you continue on your current medications as directed. Please refer to the Current Medication list given to you today.  Corine ShelterLuke Kilroy, PA-C, recommends that you schedule a follow-up appointment in 6 months with Dr Tresa EndoKelly. You will receive a reminder letter in the mail two months in advance. If you don't receive a letter, please call our office to schedule the follow-up appointment.  If you need a refill on your cardiac medications before your next appointment, please call your pharmacy.   You have been cleared for surgery! We will fax your clearance letter to Dr Ovidio Kinavid Newman.

## 2016-02-15 ENCOUNTER — Encounter: Payer: Self-pay | Admitting: *Deleted

## 2016-03-15 ENCOUNTER — Telehealth: Payer: Self-pay | Admitting: Cardiovascular Disease

## 2016-03-15 NOTE — Telephone Encounter (Signed)
New message      Request for surgical clearance:  1. What type of surgery is being performed?  Bilateral ing hernia repair  2. When is this surgery scheduled? 03-22-16  Are there any medications that need to be held prior to surgery and how long? Hold baby aspirin?  Pt has been cleared adn hold brilinta instructions were given; however baby aspirin was not addressed and pt has recently had a stent placed. 3. Name of physician performing surgery? Dr Ezzard StandingNewman  4. What is your office phone and fax number?  Fax [phone]

## 2016-03-15 NOTE — Telephone Encounter (Signed)
Message routed to Dr. Tresa EndoKelly to review.  Was cleared by Franky MachoLuke, GeorgiaPA and Marden NobleBrilinta was addressed - need to know about asa 81mg  Stent was 08/2015

## 2016-03-19 ENCOUNTER — Telehealth: Payer: Self-pay | Admitting: Cardiovascular Disease

## 2016-03-19 NOTE — Telephone Encounter (Signed)
Returned call Maralyn SagoSarah at Surgical [facility] no answer.Left message on personal voice mail spoke to DOD Dr.Harding he advised do not stop aspirin.Spoke to patient advised do not stop aspirin before surgery.

## 2016-03-19 NOTE — Telephone Encounter (Signed)
Message addressed with Dr Herbie BaltimoreHarding by Anabel Halonheryl Pugh, LPN  On 3/29/517/17/17 (DOD)

## 2016-03-19 NOTE — Telephone Encounter (Signed)
New message  [facility] wants to know if he needs to continue the aspirin or discontinue it prior to surgery. Please call.

## 2016-06-23 IMAGING — NM NM MISC PROCEDURE
6 series · 36 of 36 positions shown · non-contrast
Comparison: none

[Series 1: wbr_r-proj_st wbr rest · 6.40mm/px · 6 of 64 frames shown]
[frame 6/64]
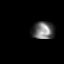
[frame 16/64]
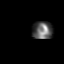
[frame 27/64]
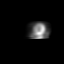
[frame 38/64]
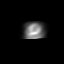
[frame 48/64]
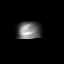
[frame 59/64]
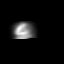

[Series 1: wbr rest · 6.40mm/px · 6 of 64 frames shown]
[frame 6/64]
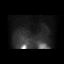
[frame 16/64]
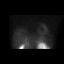
[frame 27/64]
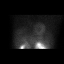
[frame 38/64]
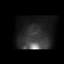
[frame 48/64]
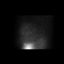
[frame 59/64]
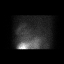

[Series 2: wbr_s-proj_st wbr stress-gsp · 6.40mm/px · 6 of 511 frames shown]
[frame 43/511]
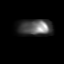
[frame 128/511]
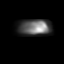
[frame 213/511]
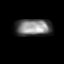
[frame 298/511]
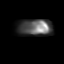
[frame 383/511]
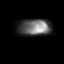
[frame 469/511]
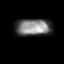

[Series 2: wbr stress-gsp · 6.40mm/px · 6 of 500 frames shown]
[frame 42/500]
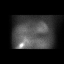
[frame 125/500]
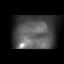
[frame 209/500]
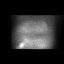
[frame 292/500]
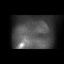
[frame 375/500]
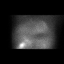
[frame 459/500]
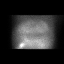

[Series 3: wbr stress-sum-em · 6.40mm/px · 6 of 64 frames shown]
[frame 6/64]
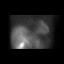
[frame 16/64]
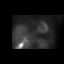
[frame 27/64]
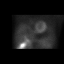
[frame 38/64]
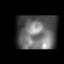
[frame 48/64]
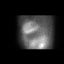
[frame 59/64]
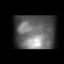

[Series 3: wbr_s-proj_st wbr stress-sum-em · 6.40mm/px · 6 of 63 frames shown]
[frame 6/63]
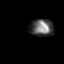
[frame 16/63]
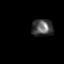
[frame 27/63]
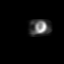
[frame 37/63]
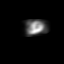
[frame 48/63]
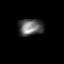
[frame 58/63]
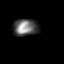

[36 of 36 positions shown; findings below may reference images not displayed]

Canned report from images found in remote index.

Refer to host system for actual result text.

## 2016-09-10 ENCOUNTER — Encounter: Payer: Self-pay | Admitting: Cardiovascular Disease

## 2016-09-10 ENCOUNTER — Ambulatory Visit (INDEPENDENT_AMBULATORY_CARE_PROVIDER_SITE_OTHER): Payer: BLUE CROSS/BLUE SHIELD | Admitting: Cardiovascular Disease

## 2016-09-10 VITALS — BP 128/74 | HR 75 | Ht 72.0 in | Wt 182.0 lb

## 2016-09-10 DIAGNOSIS — I10 Essential (primary) hypertension: Secondary | ICD-10-CM | POA: Diagnosis not present

## 2016-09-10 DIAGNOSIS — I252 Old myocardial infarction: Secondary | ICD-10-CM

## 2016-09-10 DIAGNOSIS — Z9861 Coronary angioplasty status: Secondary | ICD-10-CM

## 2016-09-10 DIAGNOSIS — I2510 Atherosclerotic heart disease of native coronary artery without angina pectoris: Secondary | ICD-10-CM

## 2016-09-10 DIAGNOSIS — K219 Gastro-esophageal reflux disease without esophagitis: Secondary | ICD-10-CM

## 2016-09-10 DIAGNOSIS — K4020 Bilateral inguinal hernia, without obstruction or gangrene, not specified as recurrent: Secondary | ICD-10-CM | POA: Diagnosis not present

## 2016-09-10 DIAGNOSIS — E785 Hyperlipidemia, unspecified: Secondary | ICD-10-CM

## 2016-09-10 MED ORDER — NITROGLYCERIN 0.4 MG SL SUBL: 0.4000 mg | SUBLINGUAL_TABLET | SUBLINGUAL | 12 refills | Status: DC | PRN

## 2016-09-10 MED ORDER — TICAGRELOR 60 MG PO TABS: 60.0000 mg | ORAL_TABLET | Freq: Two times a day (BID) | ORAL | 11 refills | Status: DC

## 2016-09-10 MED ORDER — CARVEDILOL 6.25 MG PO TABS: 6.2500 mg | ORAL_TABLET | Freq: Two times a day (BID) | ORAL | 11 refills | Status: DC

## 2016-09-10 NOTE — Progress Notes (Signed)
Patient ID: George Jimenez, male   DOB: [birth year], 61 y.o.   MRN: [id number]       PCP: [name]  HPI: George Jimenez is a 61 y.o. male who presents to the office today for a 9 month follow up cardiology evaluation following his ST segment elevation myocardial infarction.  [name] developed new onset chest discomfort while working out on 08/17/2015 and presented to [facility] emergency room.  His initial ECG was unremarkable, but he then developed profound hyperacute T waves with inferolateral ST depression in ST elevation anterolaterally.  A code STEMI was activated and he was transported to [facility] where he underwent emergent cardiac catheterization by me.  He was found to have mild acute LV dysfunction with an EF 45-50% and mild mid anterolateral hypocontractility.  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  His circumflex vessel was normal and ithere was smooth 20% proximal RCA narrowing of 60-70% mid stenosis between areas of ectasia.  He underwent successful PCI with DES stenting to the proximal LAD with ultimate insertion of a 3.522 mm Resolute DES stent postdilated 3.73 mm.  The 99% stenosis was reduced to 0%.  An ejection fraction done the day following his acute intervention revealed normalization of LV function with an ejection fraction of 60-65%.  There was mild residual hypokinesis of the anteroseptal wall.  There was grade 1 diastolic dysfunction.  Subsequently, he has done well.  He participated in the cardiac rehabilitation program.  He is now back to running and is running up to 4 miles and 32 minutes.  He denies any recurrent chest pain symptoms.  He denies palpitations.  He does have some issues with varicose veins.    When I last saw him, he  was in need for bilateral hernia surgery.  I recommended that he undergo a preoperative nuclear perfusion study in this was done on 02/07/2016.  On his study, the calculated ejection fraction was reduced, but it was not felt to be accurate and visually it appeared to be at least 50%.  He did not develop any ST segment changes during stress.  There was probable diaphragmatic attenuation artifact.  Of note, he had entirely normal perfusion in the LAD territory.  He tolerated his hernia surgery well from a cardiovascular standpoint and this was done on 03/22/2016 by Dr. [name].  Presently, he continues to exercise and runs almost every day up to 2 2-1/2 miles on a treadmill.  At times he has noted some chest burning when he is lying down at night, but denies any exertionally precipitated chest pain or pressure or palpitations with activity.  This discomfort is different from his MI pain.  He tells me that his primary physician had recently checked his laboratory last month and his total cholesterol was 105, LDL 55, HDL 38, and platelet count was 139K.  He presents for evaluation.   Past Medical History:  Diagnosis Date  . Diverticulitis   . Dyslipidemia    low HDL  . HTN (hypertension)   . STEMI (ST elevation myocardial infarction) ([place]) 08/17/2015   DES LAD    Past Surgical History:  Procedure Laterality Date  . APPENDECTOMY    . CARDIAC CATHETERIZATION N/A 08/17/2015   Procedure: Left Heart Cath  and Coronary Angiography;  Surgeon: [name], MD; LAD 99 & 40%, RCA 40%, EF 45-50%   . CARDIAC CATHETERIZATION Right 08/17/2015   Procedure: Coronary Stent Intervention;  Surgeon: [name], MD; mLAD 99%>0 w/ 3.5 x 22 mm Resolute DES; Laterality: Right     Allergies  Allergen Reactions  . Levaquin [Levofloxacin In D5w]   . Ace Inhibitors Cough    Current Outpatient Prescriptions  Medication Sig Dispense Refill  . aspirin EC 81 MG tablet Take 81 mg by mouth daily.    Marland Kitchen  atorvastatin (LIPITOR) 80 MG tablet Take 1 tablet (80 mg total) by mouth daily at 6 PM. (Patient taking differently: Take 40 mg by mouth daily at 6 PM. ) 30 tablet 11  . losartan (COZAAR) 50 MG tablet Take 50 mg by mouth daily.  12  . Multiple Vitamin (MULTIVITAMIN WITH MINERALS) TABS tablet Take 1 tablet by mouth daily. Reported on 09/06/2015    . nitroGLYCERIN (NITROSTAT) 0.4 MG SL tablet Place 1 tablet (0.4 mg total) under the tongue every 5 (five) minutes as needed for chest pain. 25 tablet 12  . selenium sulfide (SELSUN) 2.5 % shampoo Apply 1 application topically daily.  3  . carvedilol (COREG) 6.25 MG tablet Take 1 tablet (6.25 mg total) by mouth 2 (two) times daily. 60 tablet 11  . ticagrelor (BRILINTA) 60 MG TABS tablet Take 1 tablet (60 mg total) by mouth 2 (two) times daily. 60 tablet 11   No current facility-administered medications for this visit.     Social History   Social History  . Marital status: Married    Spouse name: N/A  . Number of children: N/A  . Years of education: N/A   Occupational History  . Not on file.   Social History Main Topics  . Smoking status: Never Smoker  . Smokeless tobacco: Never Used  . Alcohol use No  . Drug use: Unknown  . Sexual activity: Not on file   Other Topics Concern  . Not on file   Social History Narrative  . 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  Family History  Problem Relation Age of Onset  . Heart attack Father     CABG in 34s    ROS General: Negative; No fevers, chills, or night sweats HEENT: Negative; No changes in vision or hearing, sinus congestion, difficulty swallowing Pulmonary: Negative; No cough, wheezing, shortness of breath, hemoptysis Cardiovascular: See HPI: No chest pain, presyncope, syncope, palpatations History of varicose veins GI: Negative; No nausea, vomiting, diarrhea, or abdominal pain GU: Bilateral inguinal  hernia Musculoskeletal: Negative; no myalgias, joint pain, or weakness Hematologic: Negative; no easy bruising, bleeding Endocrine: Negative; no heat/cold intolerance; no diabetes, Neuro: Negative; no changes in balance, headaches Skin: Negative; No rashes or skin lesions Psychiatric: Negative; No behavioral problems, depression Sleep: Negative; No snoring,  daytime sleepiness, hypersomnolence, bruxism, restless legs, hypnogognic hallucinations. Other comprehensive 14 point system review is negative   Physical Exam BP 128/74   Pulse 75   Ht 6' (1.829 m)   Wt 182 lb (82.6 kg)   BMI 24.68 kg/m  Wt Readings from Last 3 Encounters:  09/10/16 182 lb (82.6 kg)  02/13/16 177 lb 6 oz (80.5 kg)  02/07/16 180 lb (81.6 kg)   General: Alert, oriented, no distress.  Skin: normal turgor, no rashes, warm and dry HEENT: Normocephalic, atraumatic. Pupils equal round and  reactive to light; sclera anicteric; extraocular muscles intact, No lid lag; Nose without nasal septal hypertrophy; Mouth/Parynx benign; Mallinpatti scale 2 Neck: No JVD, no carotid bruits; normal carotid upstroke Lungs: clear to ausculatation and percussion bilaterally; no wheezing or rales, normal inspiratory and expiratory effort Chest wall: without tenderness to palpitation Heart: PMI not displaced, RRR, s1 s2 normal, 1/6 systolic murmur, No diastolic murmur, no rubs, gallops, thrills, or heaves Abdomen: Bilateral inguinal hernias with a larger hernia sac on the left compared to the right; soft, nontender; no hepatosplenomehaly, BS+; abdominal aorta nontender and not dilated by palpation. Back: no CVA tenderness Pulses: 2+  Musculoskeletal: full range of motion, normal strength, no joint deformities Extremities: Pulses 2+, no clubbing cyanosis or edema, Homan's sign negative  Neurologic: grossly nonfocal; Cranial nerves grossly wnl Psychologic: Normal mood and affect  ECG (independently read by me): Normal sinus rhythm at 75  bpm.  No ectopy.  No significant ST changes.  No ECG evidence for prior MI  April 2017 ECG (independently read by me): Normal sinus rhythm at 61 bpm.  No ECG evidence for his recent myocardial infarction.  Nondiagnostic T-wave in lead aVL.  LABS:  BMP Latest Ref Rng & Units 11/25/2015 08/18/2015 08/17/2015  Glucose 65 - 99 mg/dL 87 169(H) 138(H)  BUN 7 - 25 mg/dL _0 Creatinine 0.70 - 1.33 mg/dL 1.07 1.02 1.21  Sodium 135 - 146 mmol/L 139 137 137  Potassium 3.5 - 5.3 mmol/L 4.5 3.7 3.8  Chloride 98 - 110 mmol/L 105 108 103  CO2 20 - 31 mmol/L 26 20(L) 25  Calcium 8.6 - 10.3 mg/dL 8.9 8.3(L) 9.1     Hepatic Function Latest Ref Rng & Units 11/25/2015  Total Protein 6.1 - 8.1 g/dL 6.9  Albumin 3.6 - 5.1 g/dL 4.0  AST 10 - 35 U/L 27  ALT 9 - 46 U/L 28  Alk Phosphatase 40 - 115 U/L 67  Total Bilirubin 0.2 - 1.2 mg/dL 0.8    CBC Latest Ref Rng & Units 08/18/2015 08/17/2015  WBC 4.0 - 10.5 K/uL 5.7 8.0  Hemoglobin 13.0 - 17.0 g/dL 11.8(L) 13.8  Hematocrit 39.0 - 52.0 % 34.5(L) 40.2  Platelets 150 - 400 K/uL 100(L) 185   Lab Results  Component Value Date   MCV 86.7 08/18/2015   MCV 86.5 08/17/2015    No results found for: TSH  BNP No results found for: BNP  ProBNP No results found for: PROBNP   Lipid Panel     Component Value Date/Time   CHOL 97 (L) 11/25/2015 0939   TRIG 70 11/25/2015 0939   HDL 36 (L) 11/25/2015 0939   CHOLHDL 2.7 11/25/2015 0939   VLDL 14 11/25/2015 0939   LDLCALC 47 11/25/2015 0939     RADIOLOGY: No results found.  IMPRESSION:  1. CAD S/P LAD DES 08/16/16   2. Essential hypertension   3. History of anterior STEMI 08/16/16   4. Bilateral inguinal hernia without obstruction or gangrene, recurrence not specified   5. Hyperlipidemia with target LDL less than 70   6. Gastroesophageal reflux disease without esophagitis     ASSESSMENT AND PLAN: Mr. Arvie Bartholomew is a 61 year old active gentleman who suffered an acute coronary syndrome  on 08/17/2015.  Emergent catheterization revealed a 99% proximal LAD stenosis for which he underwent successful stenting with a DES stent.  He also was found to have 65% stenosis in his mid RCA for which medical therapy was recommended.  He has been documented  have complete normalization of LV function.  He continues to be symptom free on his current medical regimen consisting of carvedilol at low dose 3.125 twice a day, losartan 50 mg daily.  He is on atorvastatin 80 mg and is on dual antiplatelet therapy with aspirin and Brilinta 90 mg twice a day.  His most recent lipid panel from his primary physician in December 2017 continues to be stable and shows an LDL cholesterol at 55.  A preoperative nuclear perfusion study done in June 2017 prior to undergoing bilateral inguinal surgery showed normal perfusion in the LAD territory.  There was diaphragmatic attenuation artifact.  Ejection fraction calculated was not accurate in visually his EF appeared at least 50% or greater.  He continues to exercise without chest pain development.  He has noticed some chest burning typically while lying flat.  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  I will change his Brilinta to 60 mg twice a day for [facility] trial data.  He has concomitant CAD.  I have recommended slight titration of carvedilol to 6.25 mg twice a day.  His blood pressure today remains stable.  I will see him in 6 months for cardiology reevaluation or sooner if problems arise.  Time spent: 25 minutes [name], MD, [facility] 09/10/2016 2:02 PM

## 2016-09-10 NOTE — Patient Instructions (Addendum)
Your physician has recommended you make the following change in your medication:   1.) the Brilinta has been changed to 60 mg twice a day.  2.) the carvedilol has been changed to 6.25 mg twice a day. ( you can take 2 of the 3.125 mg twice a day until completed)  Your physician wants you to follow-up in: 6 months or sooner if needed. You will receive a reminder letter in the mail two months in advance. If you don't receive a letter, please call our office to schedule the follow-up appointment.

## 2016-11-08 ENCOUNTER — Telehealth: Payer: Self-pay | Admitting: Cardiovascular Disease

## 2016-11-08 NOTE — Telephone Encounter (Signed)
New Message  Pt c/o medication issue:  1. Name of Medication: brilinta 60 mg twice a day  2. How are you currently taking this medication (dosage and times per day)? See above  3. Are you having a reaction (difficulty breathing--STAT)? N/A  4. What is your medication issue? Pt voiced wants to know if he can stop taking Brilinta and take advil due to tylenol not working and his temp is extremely high.

## 2016-11-08 NOTE — Telephone Encounter (Signed)
Talked to patient today. He already talked to PCP and no addiotinal therapy recommended.  Recommendations given to patient:  1. Continue tylenol 1000mg  q 6 hrs up to 3 times per day to keep Tem under control.  2. Okay to take 1 single dose of Advil 200mg  tonight so he can sleep better.  3. Use cold compress to control body temperature and stay hydrated

## 2016-11-08 NOTE — Telephone Encounter (Signed)
Pt of Dr. Tresa EndoKelly CAD, Hx STEMI w DES 08/17/2015  Pt had onset Monday of general malaise, congestion, by Tuesday had gotten fever w hallmark flu symptoms.   Temp hasn't gone below 101 despite use of tylenol. No/little relief of symptoms. Wanting to take advil but aware of potential bleed risk/adverse cardiac reaction.  He was stopped on Brilinta last June for surgery - asking if OK again to interrupt Brilinta briefly so that he can take advil for better pain/fever control. Informed him I would seek advice, not sure if reasonable to stop Brilinta regardless due to duration of action and delayed clearance time in system. Please advise if OK to use advil in limited quantity, other alternatives.

## 2016-11-11 NOTE — Telephone Encounter (Signed)
ok 

## 2017-02-08 ENCOUNTER — Other Ambulatory Visit: Payer: Self-pay | Admitting: Physician Assistant

## 2017-10-14 ENCOUNTER — Other Ambulatory Visit: Payer: Self-pay | Admitting: Cardiovascular Disease

## 2017-10-14 NOTE — Telephone Encounter (Signed)
REFILL 

## 2017-10-31 ENCOUNTER — Other Ambulatory Visit: Payer: Self-pay | Admitting: Cardiovascular Disease

## 2017-11-12 ENCOUNTER — Other Ambulatory Visit: Payer: Self-pay | Admitting: Cardiovascular Disease

## 2017-12-11 ENCOUNTER — Other Ambulatory Visit: Payer: Self-pay | Admitting: Cardiovascular Disease

## 2017-12-11 NOTE — Telephone Encounter (Signed)
REFILL 

## 2017-12-30 ENCOUNTER — Other Ambulatory Visit: Payer: Self-pay | Admitting: Cardiovascular Disease

## 2017-12-30 NOTE — Telephone Encounter (Signed)
Rx sent to pharmacy   

## 2018-01-06 ENCOUNTER — Telehealth: Payer: Self-pay | Admitting: Cardiovascular Disease

## 2018-01-06 MED ORDER — CARVEDILOL 6.25 MG PO TABS: 6.2500 mg | ORAL_TABLET | Freq: Two times a day (BID) | ORAL | 1 refills | Status: DC

## 2018-01-06 MED ORDER — TICAGRELOR 60 MG PO TABS: 60.0000 mg | ORAL_TABLET | Freq: Two times a day (BID) | ORAL | 1 refills | Status: DC

## 2018-01-06 NOTE — Telephone Encounter (Signed)
New Message    *STAT* If patient is at the pharmacy, call can be transferred to refill team.   1. Which medications need to be refilled? (please list name of each medication and dose if known) carvedilol (COREG) 6.25 MG tablet and ticagrelor (BRILINTA) 60 MG TABS tablet  2. Which pharmacy/location (including street and city if local pharmacy) is medication to be sent to? [facility]   3. Do they need a 30 day or 90 day supply? 30 day    Appointment has been made patient is out.

## 2018-02-03 ENCOUNTER — Ambulatory Visit (INDEPENDENT_AMBULATORY_CARE_PROVIDER_SITE_OTHER): Payer: PRIVATE HEALTH INSURANCE | Admitting: Physician Assistant

## 2018-02-03 ENCOUNTER — Encounter: Payer: Self-pay | Admitting: Physician Assistant

## 2018-02-03 VITALS — BP 130/78 | HR 61 | Ht 72.0 in | Wt 183.4 lb

## 2018-02-03 DIAGNOSIS — I2510 Atherosclerotic heart disease of native coronary artery without angina pectoris: Secondary | ICD-10-CM

## 2018-02-03 DIAGNOSIS — E785 Hyperlipidemia, unspecified: Secondary | ICD-10-CM

## 2018-02-03 DIAGNOSIS — I10 Essential (primary) hypertension: Secondary | ICD-10-CM

## 2018-02-03 NOTE — Progress Notes (Signed)
Cardiology Office Note    Date:  02/03/2018   ID:  George Jimenez, DOB 1956-05-14, MRN [id number]  PCP:  Danella Penton, MD  Cardiologist:  Dr. Tresa Endo   Chief Complaint  Patient presents with  . Follow-up    no complaints per patient. Annual visit    History of Present Illness:  George Jimenez is a 62 y.o. male with PMH of HTN, HLD and CAD.  He underwent cardiac catheterization in December 2016 for anterolateral STEMI and found to have EF 45 to 50%, two-vessel CAD with 20% smooth ostial narrowing of LAD and a 99% proximal LAD disease treated with a drug-eluting stent, he also had 60 to 70% mid RCA disease.  Echocardiogram obtained on the same day however showed EF 60 to 65%, mild residual hypokinesis of the anteroseptal wall, grade 1 DD.  Preoperative clearance, he underwent perfusion study in June 2017, the calculated EF was reduced however this was not felt to be accurate and visually appears to be at least 50%, there was probable diaphragmatic attenuation artifact otherwise he had entirely normal perfusion in the LAD territory.  He was last seen by Dr. Tresa Endo in January 2018, at which time she was doing well.  His Brilinta was reduced to 60 mg twice daily.  Last lipid panel obtained in April 2019 showed a total cholesterol of 101, triglyceride 87, LDL 48, HDL 36.  Patient presents today for cardiology office visit.  He has been recently started on Krill oil for his low HDL.  Otherwise he continued to run up to more than 2 miles on a daily basis.  He denies any exertional chest discomfort or shortness of breath.  He is still compliant with aspirin and Brilinta 60 mg twice daily.  His blood pressure seems to be very well controlled.  If there is no lower extremity edema, orthopnea or PND.   Past Medical History:  Diagnosis Date  . Diverticulitis   . Dyslipidemia    low HDL  . HTN (hypertension)   . STEMI (ST elevation myocardial infarction) (HCC) 08/17/2015   DES LAD    Past Surgical  History:  Procedure Laterality Date  . APPENDECTOMY    . Bilateral inguinal hernia surgery   03/23/2015  . CARDIAC CATHETERIZATION N/A 08/17/2015   Procedure: Left Heart Cath and Coronary Angiography;  Surgeon: Lennette Bihari, MD; LAD 99 & 40%, RCA 40%, EF 45-50%   . CARDIAC CATHETERIZATION Right 08/17/2015   Procedure: Coronary Stent Intervention;  Surgeon: Lennette Bihari, MD; mLAD 99%>0 w/ 3.5 x 22 mm Resolute DES; Laterality: Right     Current Medications: Outpatient Medications Prior to Visit  Medication Sig Dispense Refill  . aspirin EC 81 MG tablet Take 81 mg by mouth daily.    Marland Kitchen atorvastatin (LIPITOR) 80 MG tablet TAKE ONE TABLET BY MOUTH EVERY DAY AT 6PM (Patient taking differently: TAKE ONE TABLET BY MOUTH EVERY DAY AT 6PM; patient is only taking half daily (40mg )) 30 tablet 3  . carvedilol (COREG) 6.25 MG tablet Take 1 tablet (6.25 mg total) by mouth 2 (two) times daily. KEEP OV. 60 tablet 1  . KRILL OIL PO Take by mouth daily.    Marland Kitchen losartan (COZAAR) 50 MG tablet Take 50 mg by mouth daily.  12  . Multiple Vitamin (MULTIVITAMIN WITH MINERALS) TABS tablet Take 1 tablet by mouth daily. Reported on 09/06/2015    . nitroGLYCERIN (NITROSTAT) 0.4 MG SL tablet Place 1 tablet (0.4 mg total) under the tongue  every 5 (five) minutes as needed for chest pain. 25 tablet 12  . selenium sulfide (SELSUN) 2.5 % shampoo Apply 1 application topically daily.  3  . ticagrelor (BRILINTA) 60 MG TABS tablet Take 1 tablet (60 mg total) by mouth 2 (two) times daily. KEEP OV. 60 tablet 1  . atorvastatin (LIPITOR) 80 MG tablet Take 1 tablet (80 mg total) by mouth daily at 6 PM. Please call to make appointment for further refills 30 tablet 0   No facility-administered medications prior to visit.      Allergies:   Levaquin [levofloxacin in d5w] and Ace inhibitors   Social History   Socioeconomic History  . Marital status: Married    Spouse name: Not on file  . Number of children: Not on file  . Years of  education: Not on file  . Highest education level: Not on file  Occupational History  . Not on file  Social Needs  . Financial resource strain: Not on file  . Food insecurity:    Worry: Not on file    Inability: Not on file  . Transportation needs:    Medical: Not on file    Non-medical: Not on file  Tobacco Use  . Smoking status: Never Smoker  . Smokeless tobacco: Never Used  Substance and Sexual Activity  . Alcohol use: No  . Drug use: Not on file  . Sexual activity: Not on file  Lifestyle  . Physical activity:    Days per week: Not on file    Minutes per session: Not on file  . Stress: Not on file  Relationships  . Social connections:    Talks on phone: Not on file    Gets together: Not on file    Attends religious service: Not on file    Active member of club or organization: Not on file    Attends meetings of clubs or organizations: Not on file    Relationship status: Not on file  Other Topics Concern  . Not on file  Social History Narrative  . Not on file     Family History:  The patient's family history includes Heart attack in his father.   ROS:   Please see the history of present illness.    ROS All other systems reviewed and are negative.   PHYSICAL EXAM:   VS:  BP 130/78 (BP Location: Left Arm)   Pulse 61   Ht 6' (1.829 m)   Wt 183 lb 6.4 oz (83.2 kg)   BMI 24.87 kg/m    GEN: Well nourished, well developed, in no acute distress  HEENT: normal  Neck: no JVD, carotid bruits, or masses Cardiac: RRR; no murmurs, rubs, or gallops,no edema  Respiratory:  clear to auscultation bilaterally, normal work of breathing GI: soft, nontender, nondistended, + BS MS: no deformity or atrophy  Skin: warm and dry, no rash Neuro:  Alert and Oriented x 3, Strength and sensation are intact Psych: euthymic mood, full affect  Wt Readings from Last 3 Encounters:  02/03/18 183 lb 6.4 oz (83.2 kg)  09/10/16 182 lb (82.6 kg)  02/13/16 177 lb 6 oz (80.5 kg)       Studies/Labs Reviewed:   EKG:  EKG is ordered today.  The ekg ordered today demonstrates normal sinus rhythm, no significant ST-T wave changes  Recent Labs: No results found for requested labs within last 8760 hours.   Lipid Panel    Component Value Date/Time   CHOL 97 (L) 11/25/2015  4098   TRIG 70 11/25/2015 0939   HDL 36 (L) 11/25/2015 0939   CHOLHDL 2.7 11/25/2015 0939   VLDL 14 11/25/2015 0939   LDLCALC 47 11/25/2015 0939    Additional studies/ records that were reviewed today include:   Cath 08/17/2015 Conclusion    Ost LAD to Prox LAD lesion, 20% stenosed.  Mid RCA lesion, 65% stenosed.  Mid LAD-2 lesion, 40% stenosed.  Mid LAD-1 lesion, 99% stenosed. Post intervention, there is a 0% residual stenosis.  There is mild left ventricular systolic dysfunction.   Mild acute LV dysfunction with an ejection fraction of 45-50% and mild mid anterolateral hypocontractility.  Two-vessel coronary obstructive disease with 20% smooth ostial narrowing of the LAD and 95-99% proximal LAD stenosis in the region of the proximal septal perforating artery followed by ectatic segment and then 40% narrowing prior to the takeoff of the first diagonal branch of the LAD; normal left circumflex coronary artery; and dominant RCA with smooth 20% proximal narrowing and 60-70% mid stenosis between areas of ectasia.  Successful PCI with DES stenting to the proximal LAD, and ultimate insertion of a 3.522 mm Resolute DES stent postdilated to 3.73 mm with the 99% stenosis being reduced to 0% without evidence for dissection and with TIMI-3 flow.  RECOMMENDATION: The patient will continue with dual antiplatelet therapy.  Since the patient presented with an acute coronary syndrome, he will be switched to Brilinta which has statistically significant improved efficacy compared to Plavix.  Initial medical therapy for his RCA stenosis.    Myoview 02/07/2016 Study Highlights    Nuclear stress EF is  calculated at 37% but visually appears to be at least 50%.  There was no ST segment deviation noted during stress.  Blood pressure demonstrated a hypertensive response to exercise.  There is a medium defect of mild severity present in the basal inferoseptal, basal inferior, mid inferoseptal and mid inferior location. The defect is non-reversible and consistent with diaphragmatic attenuation artifact and extracardiac activity. No ischemia noted.  The left ventricular ejection fraction is moderately decreased (30-44%).  This is a low risk study.       ASSESSMENT:    1. Coronary artery disease involving native coronary artery of native heart without angina pectoris   2. Essential hypertension   3. Hyperlipidemia, unspecified hyperlipidemia type      PLAN:  In order of problems listed above:  1. CAD: Continue aspirin and Lipitor.  No recent angina.  Patient continue daily activity without exertional symptoms.  He is on aspirin and low-dose Brilinta 60 mg twice daily for maintenance therapy  2. Hypertension: Blood pressure well controlled on carvedilol and losartan.  3. Hyperlipidemia: On Lipitor and krill oil.  His total cholesterol, LDL and triglyceride very well controlled, HDL borderline low.    Medication Adjustments/Labs and Tests Ordered: Current medicines are reviewed at length with the patient today.  Concerns regarding medicines are outlined above.  Medication changes, Labs and Tests ordered today are listed in the Patient Instructions below. Patient Instructions  Medication Instructions:  Your physician recommends that you continue on your current medications as directed. Please refer to the Current Medication list given to you today.  Labwork: NONE  Testing/Procedures: NONE  Follow-Up: Your physician wants you to follow-up in: 6-9 MONTHS WITH DR Tresa Endo   You will receive a reminder letter in the mail two months in advance. If you don't receive a letter, please  call our office to schedule the follow-up appointment.  If you need  a refill on your cardiac medications before your next appointment, please call your pharmacy.     George Jimenez, [place]  02/03/2018 4:57 PM    [facility] [address] [place], [place], [place]  [address] Phone: [phone]; Fax: [phone]

## 2018-02-03 NOTE — Patient Instructions (Addendum)
Medication Instructions:  Your physician recommends that you continue on your current medications as directed. Please refer to the Current Medication list given to you today.  Labwork: NONE  Testing/Procedures: NONE  Follow-Up: Your physician wants you to follow-up in: 6-9 MONTHS WITH DR Tresa EndoKELLY   You will receive a reminder letter in the mail two months in advance. If you don't receive a letter, please call our office to schedule the follow-up appointment.  If you need a refill on your cardiac medications before your next appointment, please call your pharmacy.

## 2018-02-12 ENCOUNTER — Other Ambulatory Visit: Payer: Self-pay | Admitting: Cardiovascular Disease

## 2018-03-05 ENCOUNTER — Other Ambulatory Visit: Payer: Self-pay | Admitting: Cardiovascular Disease

## 2018-03-15 ENCOUNTER — Other Ambulatory Visit: Payer: Self-pay | Admitting: Student

## 2018-03-15 ENCOUNTER — Other Ambulatory Visit: Payer: Self-pay | Admitting: Cardiovascular Disease

## 2018-03-15 MED ORDER — TICAGRELOR 60 MG PO TABS: 60.0000 mg | ORAL_TABLET | Freq: Two times a day (BID) | ORAL | 11 refills | Status: DC

## 2018-03-15 MED ORDER — ATORVASTATIN CALCIUM 80 MG PO TABS: 80.0000 mg | ORAL_TABLET | Freq: Every day | ORAL | 11 refills | Status: DC

## 2018-04-15 ENCOUNTER — Other Ambulatory Visit: Payer: Self-pay | Admitting: Cardiovascular Disease

## 2018-10-02 ENCOUNTER — Ambulatory Visit: Payer: PRIVATE HEALTH INSURANCE | Admitting: Cardiovascular Disease

## 2018-10-02 ENCOUNTER — Encounter: Payer: Self-pay | Admitting: Cardiovascular Disease

## 2018-10-02 ENCOUNTER — Encounter (INDEPENDENT_AMBULATORY_CARE_PROVIDER_SITE_OTHER): Payer: Self-pay

## 2018-10-02 VITALS — BP 110/80 | HR 65 | Ht 72.0 in | Wt 188.0 lb

## 2018-10-02 DIAGNOSIS — I10 Essential (primary) hypertension: Secondary | ICD-10-CM | POA: Diagnosis not present

## 2018-10-02 DIAGNOSIS — I2510 Atherosclerotic heart disease of native coronary artery without angina pectoris: Secondary | ICD-10-CM | POA: Diagnosis not present

## 2018-10-02 DIAGNOSIS — E785 Hyperlipidemia, unspecified: Secondary | ICD-10-CM

## 2018-10-02 DIAGNOSIS — I249 Acute ischemic heart disease, unspecified: Secondary | ICD-10-CM | POA: Diagnosis not present

## 2018-10-02 MED ORDER — TICAGRELOR 60 MG PO TABS: 60.0000 mg | ORAL_TABLET | Freq: Two times a day (BID) | ORAL | 11 refills | Status: DC

## 2018-10-02 MED ORDER — ATORVASTATIN CALCIUM 80 MG PO TABS: 40.0000 mg | ORAL_TABLET | Freq: Every day | ORAL | 5 refills | Status: DC

## 2018-10-02 MED ORDER — CARVEDILOL 6.25 MG PO TABS: 6.2500 mg | ORAL_TABLET | Freq: Two times a day (BID) | ORAL | 11 refills | Status: DC

## 2018-10-02 MED ORDER — LOSARTAN POTASSIUM 50 MG PO TABS: 50.0000 mg | ORAL_TABLET | Freq: Every day | ORAL | 3 refills | Status: DC

## 2018-10-02 MED ORDER — NITROGLYCERIN 0.4 MG SL SUBL: SUBLINGUAL_TABLET | SUBLINGUAL | 3 refills | Status: DC

## 2018-10-02 NOTE — Progress Notes (Signed)
Patient ID: George Jimenez, male   DOB: Jan 22, 1956, 63 y.o.   MRN: [id number]       PCP: [name]  HPI: George Jimenez is a 63 y.o. male who presents to the office today for a 2-year follow-up cardiology evaluation.  George Jimenez developed new onset chest discomfort while working out on 08/17/2015 and presented to [facility] emergency room.  His initial ECG was unremarkable, but he then developed profound hyperacute T waves with inferolateral ST depression in ST elevation anterolaterally.  A code STEMI was activated and he was transported to [facility] where he underwent emergent cardiac catheterization by me.  He was found to have mild acute LV dysfunction with an EF 45-50% and mild mid anterolateral hypocontractility.  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  His circumflex vessel was normal and ithere was smooth 20% proximal RCA narrowing of 60-70% mid stenosis between areas of ectasia.  He underwent successful PCI with DES stenting to the proximal LAD with ultimate insertion of a 3.522 mm Resolute DES stent postdilated 3.73 mm.  The 99% stenosis was reduced to 0%.  An ejection fraction done the day following his acute intervention revealed normalization of LV function with an ejection fraction of 60-65%.  There was mild residual hypokinesis of the anteroseptal wall.  There was grade 1 diastolic dysfunction.  Subsequently, he has done well.  He participated in the cardiac rehabilitation program.  He is now back to running and is running up to 4 miles and 32 minutes.  He denies any recurrent chest pain symptoms.  He denies palpitations.  He does have some issues with varicose veins.    Prior to undergoing bilateral hernia surgery I recommended that he undergo a  preoperative nuclear perfusion study in this was done on 02/07/2016.  On his study, the calculated ejection fraction was reduced, but it was not felt to be accurate and visually it appeared to be at least 50%.  He did not develop any ST segment changes during stress.  There was probable diaphragmatic attenuation artifact.  Of note, he had entirely normal perfusion in the LAD territory.  He tolerated his hernia surgery well from a cardiovascular standpoint and this was done on 03/22/2016 by Dr. [name].   I last saw him in January 2018 at which time he was continuing to exercise and run almost every day up to 2 2-1/2 miles on a treadmill.  At times he has noted some chest burning when he is lying down at night, but denies any exertionally precipitated chest pain or pressure or palpitations with activity.  This discomfort is different from his MI pain.  He tells me that his primary physician had recently checked his laboratory last month and his total cholesterol was 105, LDL 55, HDL 38, and platelet count was 139K.  Since I last saw him, he has remained stable.  He is no longer running but is now using the palatine bike for at least 30 to 45 minutes 7 days/week every morning.  He sees [name] at [facility] clinic and laboratory in December 2019 showed a total cholesterol 104, LDL 52, HDL 38, and triglycerides at 80.  He denies any recurrent chest pain.  He denies palpitations.  He denies presyncope or  syncope.  He presents for 2-year follow-up evaluation.   Past Medical History:  Diagnosis Date  . Diverticulitis   . Dyslipidemia    low HDL  . HTN (hypertension)   . STEMI (ST elevation myocardial infarction) ([place]) 08/17/2015   DES LAD    Past Surgical History:  Procedure Laterality Date  . APPENDECTOMY    . Bilateral inguinal hernia surgery   03/23/2015  . CARDIAC CATHETERIZATION N/A 08/17/2015   Procedure: Left Heart Cath and Coronary Angiography;  Surgeon: [name], MD; LAD 99 &  40%, RCA 40%, EF 45-50%   . CARDIAC CATHETERIZATION Right 08/17/2015   Procedure: Coronary Stent Intervention;  Surgeon: [name], MD; mLAD 99%>0 w/ 3.5 x 22 mm Resolute DES; Laterality: Right     Allergies  Allergen Reactions  . Levaquin [Levofloxacin In D5w]   . Ace Inhibitors Cough    Current Outpatient Medications  Medication Sig Dispense Refill  . aspirin EC 81 MG tablet Take 81 mg by mouth daily.    Marland Kitchen atorvastatin (LIPITOR) 80 MG tablet Take 0.5 tablets (40 mg total) by mouth daily at 6 PM. 30 tablet 5  . carvedilol (COREG) 6.25 MG tablet Take 1 tablet (6.25 mg total) by mouth 2 (two) times daily. 60 tablet 11  . KRILL OIL PO Take by mouth daily.    Marland Kitchen losartan (COZAAR) 50 MG tablet Take 1 tablet (50 mg total) by mouth daily. 90 tablet 3  . Multiple Vitamin (MULTIVITAMIN WITH MINERALS) TABS tablet Take 1 tablet by mouth daily. Reported on 09/06/2015    . nitroGLYCERIN (NITROSTAT) 0.4 MG SL tablet DISSOLVE ONE TABLET UNDER THE TONGUE AS NEEDED FOR CHEST PAIN. MAY REPEAT AS INDICATED BY YOUR DOCTOR. 25 tablet 3  . selenium sulfide (SELSUN) 2.5 % shampoo Apply 1 application topically daily.  3  . ticagrelor (BRILINTA) 60 MG TABS tablet Take 1 tablet (60 mg total) by mouth 2 (two) times daily. 60 tablet 11   No current facility-administered medications for this visit.     Social History   Socioeconomic History  . Marital status: Married    Spouse name: Not on file  . Number of children: Not on file  . Years of education: Not on file  . Highest education level: Not on file  Occupational History  . Not on file  Social Needs  . Financial resource strain: Not on file  . Food insecurity:    Worry: Not on file    Inability: Not on file  . Transportation needs:    Medical: Not on file    Non-medical: Not on file  Tobacco Use  . Smoking status: Never Smoker  . Smokeless tobacco: Never Used  Substance and Sexual Activity  . Alcohol use: No  . Drug use: Not on file  .  Sexual activity: Not on file  Lifestyle  . Physical activity:    Days per week: Not on file    Minutes per session: Not on file  . Stress: Not on file  Relationships  . Social connections:    Talks on phone: Not on file    Gets together: Not on file    Attends religious service: Not on file    Active member of club or organization: Not on file    Attends meetings of clubs or organizations: Not on file    Relationship status: Not on file  . Intimate partner violence:    Fear of current or ex partner: Not on file  Emotionally abused: Not on file    Physically abused: Not on file    Forced sexual activity: Not on file  Other Topics Concern  . Not on file  Social History Narrative  . 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  Family History  Problem Relation Age of Onset  . Heart attack Father        CABG in 43s    ROS General: Negative; No fevers, chills, or night sweats HEENT: Negative; No changes in vision or hearing, sinus congestion, difficulty swallowing Pulmonary: Negative; No cough, wheezing, shortness of breath, hemoptysis Cardiovascular: See HPI: No chest pain, presyncope, syncope, palpatations History of varicose veins GI: Negative; No nausea, vomiting, diarrhea, or abdominal pain GU: Bilateral inguinal hernia Musculoskeletal: Negative; no myalgias, joint pain, or weakness Hematologic: Negative; no easy bruising, bleeding Endocrine: Negative; no heat/cold intolerance; no diabetes, Neuro: Negative; no changes in balance, headaches Skin: Negative; No rashes or skin lesions Psychiatric: Negative; No behavioral problems, depression Sleep: Negative; No snoring,  daytime sleepiness, hypersomnolence, bruxism, restless legs, hypnogognic hallucinations. Other comprehensive 14 point system review is negative   Physical Exam BP 110/80 (BP Location: Right Arm, Patient Position: Sitting, Cuff Size:  Normal)   Pulse 65   Ht 6' (1.829 m)   Wt 188 lb (85.3 kg)   BMI 25.50 kg/m    Repeat blood pressure by me 118/78  Wt Readings from Last 3 Encounters:  10/02/18 188 lb (85.3 kg)  02/03/18 183 lb 6.4 oz (83.2 kg)  09/10/16 182 lb (82.6 kg)   General: Alert, oriented, no distress.  Skin: normal turgor, no rashes, warm and dry HEENT: Normocephalic, atraumatic. Pupils equal round and reactive to light; sclera anicteric; extraocular muscles intact;  Nose without nasal septal hypertrophy Mouth/Parynx benign; Mallinpatti scale 2 Neck: No JVD, no carotid bruits; normal carotid upstroke Lungs: clear to ausculatation and percussion; no wheezing or rales Chest wall: without tenderness to palpitation Heart: PMI not displaced, RRR, s1 s2 normal, 1/6 systolic murmur, no diastolic murmur, no rubs, gallops, thrills, or heaves Abdomen: soft, nontender; no hepatosplenomehaly, BS+; abdominal aorta nontender and not dilated by palpation. Back: no CVA tenderness Pulses 2+ Musculoskeletal: full range of motion, normal strength, no joint deformities Extremities: no clubbing cyanosis or edema, Homan's sign negative  Neurologic: grossly nonfocal; Cranial nerves grossly wnl Psychologic: Normal mood and affect   ECG (independently read by me): Normal sinus rhythm at 65 bpm.  No ectopy.  Normal intervals.  January 2018 ECG (independently read by me): Normal sinus rhythm at 75 bpm.  No ectopy.  No significant ST changes.  No ECG evidence for prior MI  April 2017 ECG (independently read by me): Normal sinus rhythm at 61 bpm.  No ECG evidence for his recent myocardial infarction.  Nondiagnostic T-wave in lead aVL.  LABS:  BMP Latest Ref Rng & Units 11/25/2015 08/18/2015 08/17/2015  Glucose 65 - 99 mg/dL 87 169(H) 138(H)  BUN 7 - 25 mg/dL 21 12 18   Creatinine 0.70 - 1.33 mg/dL 1.07 1.02 1.21  Sodium 135 - 146 mmol/L 139 137 137  Potassium 3.5 - 5.3 mmol/L 4.5 3.7 3.8  Chloride 98 - 110 mmol/L 105 108  103  CO2 20 - 31 mmol/L 26 20(L) 25  Calcium 8.6 - 10.3 mg/dL 8.9 8.3(L) 9.1     Hepatic Function Latest Ref Rng & Units 11/25/2015  Total Protein 6.1 - 8.1 g/dL 6.9  Albumin 3.6 - 5.1 g/dL 4.0  AST 10 - 35 U/L 27  ALT 9 - 46 U/L 28  Alk Phosphatase 40 - 115 U/L 67  Total Bilirubin 0.2 - 1.2 mg/dL 0.8    CBC Latest Ref Rng & Units 08/18/2015 08/17/2015  WBC 4.0 - 10.5 K/uL 5.7 8.0  Hemoglobin 13.0 - 17.0 g/dL 11.8(L) 13.8  Hematocrit 39.0 - 52.0 % 34.5(L) 40.2  Platelets 150 - 400 K/uL 100(L) 185   Lab Results  Component Value Date   MCV 86.7 08/18/2015   MCV 86.5 08/17/2015    No results found for: TSH  BNP No results found for: BNP  ProBNP No results found for: PROBNP   Lipid Panel     Component Value Date/Time   CHOL 97 (L) 11/25/2015 0939   TRIG 70 11/25/2015 0939   HDL 36 (L) 11/25/2015 0939   CHOLHDL 2.7 11/25/2015 0939   VLDL 14 11/25/2015 0939   LDLCALC 47 11/25/2015 0939   Recent laboratory December 2019 from [name]: Cholesterol 104, LDL 52, HDL 38, triglycerides 80  RADIOLOGY: No results found.  IMPRESSION:  1. Essential hypertension   2. ACS (acute coronary syndrome) [facility]): August 17, 2015, status post DES stenting to 99% proximal LAD stenosis   3. Coronary artery disease involving native coronary artery of native heart without angina pectoris   4. Hyperlipidemia with target LDL less than 70     ASSESSMENT AND PLAN: George Jimenez is a 63 year old active gentleman who suffered an acute coronary syndrome on 08/17/2015.  Emergent catheterization revealed a 99% proximal LAD stenosis for which he underwent successful stenting with a DES stent.  He also was found to have 65% stenosis in his mid RCA for which medical therapy was recommended.  He has been documented have complete normalization of LV function.  Pressure today is excellent on carvedilol 6.25 mg twice a day and losartan 50 mg daily.  He continues to be on aspirin and low-dose  Brilinta 60 mg twice daily.  He is tolerating this well.  He continues to be on atorvastatin and now takes 40 mg daily.  He underwent laboratory last month by his primary physician and LDL cholesterol was excellent at 52.  I commended him on his 7 day/week exercise program.  His blood pressure is stable.  ECG is stable.  I will see him in 1 year for reevaluation.  Time spent: 25 minutes [name], MD, [facility] 10/04/2018 2:48 PM

## 2018-10-02 NOTE — Patient Instructions (Signed)

## 2018-10-04 ENCOUNTER — Encounter: Payer: Self-pay | Admitting: Cardiovascular Disease

## 2019-10-08 ENCOUNTER — Encounter (INDEPENDENT_AMBULATORY_CARE_PROVIDER_SITE_OTHER): Payer: Self-pay

## 2019-10-08 ENCOUNTER — Ambulatory Visit: Payer: PRIVATE HEALTH INSURANCE | Admitting: Cardiovascular Disease

## 2019-10-08 ENCOUNTER — Other Ambulatory Visit: Payer: Self-pay

## 2019-10-08 ENCOUNTER — Encounter: Payer: Self-pay | Admitting: Cardiovascular Disease

## 2019-10-08 VITALS — BP 142/96 | HR 59 | Ht 72.0 in | Wt 185.0 lb

## 2019-10-08 DIAGNOSIS — I2510 Atherosclerotic heart disease of native coronary artery without angina pectoris: Secondary | ICD-10-CM

## 2019-10-08 DIAGNOSIS — R220 Localized swelling, mass and lump, head: Secondary | ICD-10-CM

## 2019-10-08 DIAGNOSIS — Z79899 Other long term (current) drug therapy: Secondary | ICD-10-CM

## 2019-10-08 DIAGNOSIS — I249 Acute ischemic heart disease, unspecified: Secondary | ICD-10-CM | POA: Diagnosis not present

## 2019-10-08 DIAGNOSIS — I10 Essential (primary) hypertension: Secondary | ICD-10-CM | POA: Diagnosis not present

## 2019-10-08 DIAGNOSIS — E785 Hyperlipidemia, unspecified: Secondary | ICD-10-CM

## 2019-10-08 MED ORDER — AMLODIPINE BESYLATE 5 MG PO TABS: 5.0000 mg | ORAL_TABLET | Freq: Every day | ORAL | 3 refills | Status: DC

## 2019-10-08 NOTE — Patient Instructions (Signed)
Medication Instructions:  BEGIN TAKING AMLODIPINE 5MG  DAILY = 1 TABLET DAILY  STOP TAKING BRILINTA  NO LONGER TAKING LOSARTAN *If you need a refill on your cardiac medications before your next appointment, please call your pharmacy*     Follow-Up: At [facility], you and your health needs are our priority.  As part of our continuing mission to provide you with exceptional heart care, we have created designated Provider Care Teams.  These Care Teams include your primary Cardiologist (physician) and Advanced Practice Providers (APPs -  Physician Assistants and Nurse Practitioners) who all work together to provide you with the care you need, when you need it.  Your next appointment:   6 month(s)  The format for your next appointment:   In Person  Provider:   [facility], MD

## 2019-10-08 NOTE — Progress Notes (Signed)
Patient ID: George Jimenez, male   DOB: 07/07/1956, 65 y.o.   MRN: [id number]       PCP: [name]  HPI: George Jimenez is a 64 y.o. male who presents to the office today for a 13 month follow-up cardiology evaluation.  George Jimenez developed new onset chest discomfort while working out on 08/17/2015 and presented to [facility] emergency room.  His initial ECG was unremarkable, but he then developed profound hyperacute T waves with inferolateral ST depression in ST elevation anterolaterally.  A code STEMI was activated and he was transported to [facility] where he underwent emergent cardiac catheterization by me.  He was found to have mild acute LV dysfunction with an EF 45-50% and mild mid anterolateral hypocontractility.  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  His circumflex vessel was normal and ithere was smooth 20% proximal RCA narrowing of 60-70% mid stenosis between areas of ectasia.  He underwent successful PCI with DES stenting to the proximal LAD with ultimate insertion of a 3.522 mm Resolute DES stent postdilated 3.73 mm.  The 99% stenosis was reduced to 0%.  An ejection fraction done the day following his acute intervention revealed normalization of LV function with an ejection fraction of 60-65%.  There was mild residual hypokinesis of the anteroseptal wall.  There was grade 1 diastolic dysfunction.  Subsequently, he has done well.  He participated in the cardiac rehabilitation program.  He is now back to running and is running up to 4 miles and 32 minutes.  He denies any recurrent chest pain symptoms.  He denies palpitations.  He does have some issues with varicose veins.    Prior to undergoing bilateral hernia surgery I recommended that he undergo a  preoperative nuclear perfusion study in this was done on 02/07/2016.  On his study, the calculated ejection fraction was reduced, but it was not felt to be accurate and visually it appeared to be at least 50%.  He did not develop any ST segment changes during stress.  There was probable diaphragmatic attenuation artifact.  Of note, he had entirely normal perfusion in the LAD territory.  He tolerated his hernia surgery well from a cardiovascular standpoint and this was done on 03/22/2016 by Dr. [name].   I last saw him in January 2018 at which time he was continuing to exercise and run almost every day up to 2 2-1/2 miles on a treadmill.  At times he has noted some chest burning when he is lying down at night, but denies any exertionally precipitated chest pain or pressure or palpitations with activity.  This discomfort is different from his MI pain.  He tells me that his primary physician had recently checked his laboratory last month and his total cholesterol was 105, LDL 55, HDL 38, and platelet count was 139K.  I last saw him in January 2020 at which time he remained stable.  He was no longer running but was using a [employer] bike for at least 30 to 45 minutes 7 days/week every morning.  He sees [name] at [facility] clinic and laboratory in December 2019 showed a total cholesterol 104, LDL 52, HDL 38, and triglycerides at 80.  He denied any recurrent chest pain, palpitations, presyncope or syncope.  Over the past year he has remained relatively stable.  Unfortunately he developed COVID-19 towards the end of 2020.  He lost his sense of taste alternations.  He only had a fever for 12 hours.  He had body aches for several days.  He was prescribed prednisone and a Z-Pak.  One month ago, his tooth cracked.  He subsequently developed swelling of his lip.  His prescribed antibiotics.  He had additional lip swelling on several occasions in different parts of his lip.  He had contacted [name] who  recommended discontinuance of losartan.  He has been off losartan for 3 days but with just given a prescription which she has not yet filled for olmesartan.  He continues to exercise daily on his bike.  There is no recurrent anginal symptoms or exertional dyspnea.  He presents for evaluation.   Past Medical History:  Diagnosis Date  . Diverticulitis   . Dyslipidemia    low HDL  . HTN (hypertension)   . STEMI (ST elevation myocardial infarction) ([place]) 08/17/2015   DES LAD    Past Surgical History:  Procedure Laterality Date  . APPENDECTOMY    . Bilateral inguinal hernia surgery   03/23/2015  . CARDIAC CATHETERIZATION N/A 08/17/2015   Procedure: Left Heart Cath and Coronary Angiography;  Surgeon: [name], MD; LAD 99 & 40%, RCA 40%, EF 45-50%   . CARDIAC CATHETERIZATION Right 08/17/2015   Procedure: Coronary Stent Intervention;  Surgeon: [name], MD; mLAD 99%>0 w/ 3.5 x 22 mm Resolute DES; Laterality: Right     Allergies  Allergen Reactions  . Levaquin [Levofloxacin In D5w]   . Ace Inhibitors Cough    Current Outpatient Medications  Medication Sig Dispense Refill  . aspirin EC 81 MG tablet Take 81 mg by mouth daily.    Marland Kitchen atorvastatin (LIPITOR) 80 MG tablet Take 0.5 tablets (40 mg total) by mouth daily at 6 PM. 30 tablet 5  . carvedilol (COREG) 6.25 MG tablet Take 1 tablet (6.25 mg total) by mouth 2 (two) times daily. 60 tablet 11  . KRILL OIL PO Take by mouth daily.    . Multiple Vitamin (MULTIVITAMIN WITH MINERALS) TABS tablet Take 1 tablet by mouth daily. Reported on 09/06/2015    . nitroGLYCERIN (NITROSTAT) 0.4 MG SL tablet DISSOLVE ONE TABLET UNDER THE TONGUE AS NEEDED FOR CHEST PAIN. MAY REPEAT AS INDICATED BY YOUR DOCTOR. 25 tablet 3  . selenium sulfide (SELSUN) 2.5 % shampoo Apply 1 application topically daily.  3  . amLODipine (NORVASC) 5 MG tablet Take 1 tablet (5 mg total) by mouth daily. 90 tablet 3   No current facility-administered medications for this  visit.    Social History   Socioeconomic History  . Marital status: Married    Spouse name: Not on file  . Number of children: Not on file  . Years of education: Not on file  . Highest education level: Not on file  Occupational History  . Not on file  Tobacco Use  . Smoking status: Never Smoker  . Smokeless tobacco: Never Used  Substance and Sexual Activity  . Alcohol use: No  . Drug use: Not on file  . Sexual activity: Not on file  Other Topics Concern  . Not on file  Social History Narrative  . Not on file   Social Determinants of Health   Financial Resource Strain:   . Difficulty of Paying Living Expenses: Not on file  Food Insecurity:   . Worried  About Running Out of Food in the Last Year: Not on file  . Ran Out of Food in the Last Year: Not on file  Transportation Needs:   . Lack of Transportation (Medical): Not on file  . Lack of Transportation (Non-Medical): Not on file  Physical Activity:   . Days of Exercise per Week: Not on file  . Minutes of Exercise per Session: Not on file  Stress:   . Feeling of Stress : Not on file  Social Connections:   . Frequency of Communication with Friends and Family: Not on file  . Frequency of Social Gatherings with Friends and Family: Not on file  . Attends Religious Services: Not on file  . Active Member of Clubs or Organizations: Not on file  . Attends Archivist Meetings: Not on file  . Marital Status: Not on file  Intimate Partner Violence:   . Fear of Current or Ex-Partner: Not on file  . Emotionally Abused: Not on file  . Physically Abused: Not on file  . 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  Family History  Problem Relation Age of Onset  . Heart attack Father        CABG in 19s    ROS General: Negative; No fevers, chills, or night sweats HEENT: Negative; No changes in vision or hearing, sinus congestion,  difficulty swallowing; recent tooth cracking for which he underwent root canal.  Several recurrent episodes of lip swelling not responsive to antibiotics. Pulmonary: Negative; No cough, wheezing, shortness of breath, hemoptysis Cardiovascular: See HPI: No chest pain, presyncope, syncope, palpatations History of varicose veins GI: Negative; No nausea, vomiting, diarrhea, or abdominal pain GU: Bilateral inguinal hernia Musculoskeletal: Negative; no myalgias, joint pain, or weakness Hematologic: Negative; no easy bruising, bleeding Endocrine: Negative; no heat/cold intolerance; no diabetes, Neuro: Negative; no changes in balance, headaches Skin: Negative; No rashes or skin lesions Psychiatric: Negative; No behavioral problems, depression Sleep: Negative; No snoring,  daytime sleepiness, hypersomnolence, bruxism, restless legs, hypnogognic hallucinations. Other comprehensive 14 point system review is negative   Physical Exam BP (!) 142/96   Pulse (!) 59   Ht 6' (1.829 m)   Wt 185 lb (83.9 kg)   BMI 25.09 kg/m    Repeat blood pressure by me 140/90; he has been off losartan for 3 days  Wt Readings from Last 3 Encounters:  10/08/19 185 lb (83.9 kg)  10/02/18 188 lb (85.3 kg)  02/03/18 183 lb 6.4 oz (83.2 kg)   General: Alert, oriented, no distress.  Skin: normal turgor, no rashes, warm and dry HEENT: Normocephalic, atraumatic. Pupils equal round and reactive to light; sclera anicteric; extraocular muscles intact; Fundi ** Nose without nasal septal hypertrophy Mouth/Parynx benign; Mallinpatti scale Neck: No JVD, no carotid bruits; normal carotid upstroke Lungs: clear to ausculatation and percussion; no wheezing or rales Chest wall: without tenderness to palpitation Heart: PMI not displaced, RRR, s1 s2 normal, 1/6 systolic murmur, no diastolic murmur, no rubs, gallops, thrills, or heaves Abdomen: soft, nontender; no hepatosplenomehaly, BS+; abdominal aorta nontender and not dilated by  palpation. Back: no CVA tenderness Pulses 2+ Musculoskeletal: full range of motion, normal strength, no joint deformities Extremities: no clubbing cyanosis or edema, Homan's sign negative  Neurologic: grossly nonfocal; Cranial nerves grossly wnl Psychologic: Normal mood and affect   ECG (independently read by me): Sinus bradycardia 59 bpm.  No ectopy.  Normal intervals  January 2020 ECG (independently read by me): Normal sinus rhythm at 65 bpm.  No ectopy.  Normal intervals.  January 2018 ECG (independently read by me): Normal sinus rhythm at 75 bpm.  No ectopy.  No significant ST changes.  No ECG evidence for prior MI  April 2017 ECG (independently read by me): Normal sinus rhythm at 61 bpm.  No ECG evidence for his recent myocardial infarction.  Nondiagnostic T-wave in lead aVL.  LABS:  BMP Latest Ref Rng & Units 11/25/2015 08/18/2015 08/17/2015  Glucose 65 - 99 mg/dL 87 169(H) 138(H)  BUN 7 - 25 mg/dL 21 12 18   Creatinine 0.70 - 1.33 mg/dL 1.07 1.02 1.21  Sodium 135 - 146 mmol/L 139 137 137  Potassium 3.5 - 5.3 mmol/L 4.5 3.7 3.8  Chloride 98 - 110 mmol/L 105 108 103  CO2 20 - 31 mmol/L 26 20(L) 25  Calcium 8.6 - 10.3 mg/dL 8.9 8.3(L) 9.1     Hepatic Function Latest Ref Rng & Units 11/25/2015  Total Protein 6.1 - 8.1 g/dL 6.9  Albumin 3.6 - 5.1 g/dL 4.0  AST 10 - 35 U/L 27  ALT 9 - 46 U/L 28  Alk Phosphatase 40 - 115 U/L 67  Total Bilirubin 0.2 - 1.2 mg/dL 0.8    CBC Latest Ref Rng & Units 08/18/2015 08/17/2015  WBC 4.0 - 10.5 K/uL 5.7 8.0  Hemoglobin 13.0 - 17.0 g/dL 11.8(L) 13.8  Hematocrit 39.0 - 52.0 % 34.5(L) 40.2  Platelets 150 - 400 K/uL 100(L) 185   Lab Results  Component Value Date   MCV 86.7 08/18/2015   MCV 86.5 08/17/2015    No results found for: TSH  BNP No results found for: BNP  ProBNP No results found for: PROBNP   Lipid Panel     Component Value Date/Time   CHOL 97 (L) 11/25/2015 0939   TRIG 70 11/25/2015 0939   HDL 36 (L) 11/25/2015  0939   CHOLHDL 2.7 11/25/2015 0939   VLDL 14 11/25/2015 0939   LDLCALC 47 11/25/2015 0939   Recent laboratory December 2019 from [name]: Cholesterol 104, LDL 52, HDL 38, triglycerides 80  RADIOLOGY: No results found.  IMPRESSION:  1. Essential hypertension   2. Coronary artery disease involving native coronary artery of native heart without angina pectoris   3. ACS (acute coronary syndrome) [facility]): August 17, 2015 treated with DES stent to LAD   4. Lip swelling   5. Hyperlipidemia with target LDL less than 70   6. Medication management     ASSESSMENT AND PLAN: [name] is a healthy-appearing 64 year old active gentleman who suffered an acute coronary syndrome on 08/17/2015.  Emergent catheterization revealed a 99% proximal LAD stenosis for which he underwent successful stenting with a DES stent.  He also was found to have 65% stenosis in his mid RCA for which medical therapy was recommended.  He has been documented have complete normalization of LV function.  He had been maintained on carvedilol 6.25 mg twice a day in addition to losartan 50 mg.  He developed COVID-19 several months ago and had fever for only 12 hours and was treated with prednisone and Z-Pak.  He was unaware of any oxygen desaturation and stayed home quarantined.  His wife also had it as well.  He has had several episodes of lip swelling.  There was concern that he may have had an infected following root canal.  However despite antibiotics he had several additional episodes rate concern for possible  angioedema.  He was advised by [name] to discontinue losartan.  Since there was concern for possible angioedema, I have recommended that he not start olmesartan since this is also an ARB and in the same class as losartan and in its place we will start him on amlodipine 5 mg daily.  Also was wondering if he could discontinue Brilinta.  It is now been over 4 years since his ACS.  He had stable.  I have agreed that  Brilinta can be discontinued but that he must continue aspirin 81 mg daily.  I will see him in 6 months for cardiology evaluation or sooner as needed.   [name], MD, [facility] 10/10/2019 11:46 AM

## 2019-10-10 ENCOUNTER — Encounter: Payer: Self-pay | Admitting: Cardiovascular Disease

## 2019-10-22 ENCOUNTER — Other Ambulatory Visit: Payer: Self-pay | Admitting: Cardiovascular Disease

## 2019-11-20 ENCOUNTER — Other Ambulatory Visit: Payer: Self-pay | Admitting: Cardiovascular Disease

## 2020-07-07 ENCOUNTER — Other Ambulatory Visit: Payer: Self-pay | Admitting: Cardiovascular Disease

## 2020-07-07 DIAGNOSIS — I2510 Atherosclerotic heart disease of native coronary artery without angina pectoris: Secondary | ICD-10-CM

## 2020-07-07 DIAGNOSIS — I249 Acute ischemic heart disease, unspecified: Secondary | ICD-10-CM

## 2020-07-07 DIAGNOSIS — I10 Essential (primary) hypertension: Secondary | ICD-10-CM

## 2020-09-06 ENCOUNTER — Other Ambulatory Visit: Payer: Self-pay

## 2020-09-06 ENCOUNTER — Ambulatory Visit: Payer: PRIVATE HEALTH INSURANCE | Admitting: Cardiovascular Disease

## 2020-09-06 ENCOUNTER — Encounter: Payer: Self-pay | Admitting: Cardiovascular Disease

## 2020-09-06 DIAGNOSIS — I2510 Atherosclerotic heart disease of native coronary artery without angina pectoris: Secondary | ICD-10-CM

## 2020-09-06 DIAGNOSIS — I10 Essential (primary) hypertension: Secondary | ICD-10-CM

## 2020-09-06 DIAGNOSIS — I249 Acute ischemic heart disease, unspecified: Secondary | ICD-10-CM

## 2020-09-06 DIAGNOSIS — E785 Hyperlipidemia, unspecified: Secondary | ICD-10-CM | POA: Diagnosis not present

## 2020-09-06 NOTE — Progress Notes (Signed)
Patient ID: George Jimenez, male   DOB: [birth year], 65 y.o.   MRN: [id number]       PCP: [name]  HPI: George Jimenez is a 65 y.o. male who presents to the office today for a 11 month follow-up cardiology evaluation.  [name] developed new onset chest discomfort while working out on 08/17/2015 and presented to [facility] emergency room.  His initial ECG was unremarkable, but he then developed profound hyperacute T waves with inferolateral ST depression in ST elevation anterolaterally.  A code STEMI was activated and he was transported to [facility] where he underwent emergent cardiac catheterization by me.  He was found to have mild acute LV dysfunction with an EF 45-50% and mild mid anterolateral hypocontractility.  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  His circumflex vessel was normal and ithere was smooth 20% proximal RCA narrowing of 60-70% mid stenosis between areas of ectasia.  He underwent successful PCI with DES stenting to the proximal LAD with ultimate insertion of a 3.522 mm Resolute DES stent postdilated 3.73 mm.  The 99% stenosis was reduced to 0%.  An ejection fraction done the day following his acute intervention revealed normalization of LV function with an ejection fraction of 60-65%.  There was mild residual hypokinesis of the anteroseptal wall.  There was grade 1 diastolic dysfunction.  Subsequently, he has done well.  He participated in the cardiac rehabilitation program.  He is now back to running and is running up to 4 miles and 32 minutes.  He denies any recurrent chest pain symptoms.  He denies palpitations.  He does have some issues with varicose veins.    Prior to undergoing bilateral hernia surgery I recommended that he undergo a  preoperative nuclear perfusion study in this was done on 02/07/2016.  On his study, the calculated ejection fraction was reduced, but it was not felt to be accurate and visually it appeared to be at least 50%.  He did not develop any ST segment changes during stress.  There was probable diaphragmatic attenuation artifact.  Of note, he had entirely normal perfusion in the LAD territory.  He tolerated his hernia surgery well from a cardiovascular standpoint and this was done on 03/22/2016 by Dr. [name].   I last saw him in January 2018 at which time he was continuing to exercise and run almost every day up to 2 2-1/2 miles on a treadmill.  At times he has noted some chest burning when he is lying down at night, but denies any exertionally precipitated chest pain or pressure or palpitations with activity.  This discomfort is different from his MI pain.  He tells me that his primary physician had recently checked his laboratory last month and his total cholesterol was 105, LDL 55, HDL 38, and platelet count was 139K.  I saw him in January 2020 at which time he remained stable.  He was no longer running but was using a Peloton bike for at least 30 to 45 minutes 7 days/week every morning.  He sees [name] at [facility] clinic and laboratory in December 2019 showed a total cholesterol 104, LDL 52, HDL 38, and triglycerides at 80.  He denied any recurrent chest pain, palpitations, presyncope or syncope.  Over the past year he has remained relatively stable.  Unfortunately he developed COVID-19 towards the end of 2020.  He lost his sense of taste alternations.  He only had a fever for 12 hours.  He had body aches for several days.  He was prescribed prednisone and a Z-Pak.  I last saw him in February 2021.  1 month prior to that evaluation his tooth that cracked and he subsequently developed swelling of his lip.  He initially was treated with antibiotics.  H  He had additional lip swelling on several  occasions in different parts of his lip.  He had contacted [name] who recommended discontinuance of losartan.  He has been off losartan for 3 days but with just given a prescription which he has not yet filled for olmesartan.  He continued to exercise daily on his bike and denied recurrent anginal symptoms or exertional dyspnea.    He subsequently had seen allergist, Dr. 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  He apparently stopped eating any meat.  Apparently several months later he inadvertently had some meat and had no reaction which raised out in his previous diagnosis.  Recently he has been eating meat intermittently without recurrent problems.  He continues to be active and does Peloton on a daily basis.  He recently had laboratory at the [facility] clinic with [name] and total cholesterol was 106 with LDL in the 30s.  He also sees Dr. Anda Latina.  He presents for evaluation.  Past Medical History:  Diagnosis Date  . Diverticulitis   . Dyslipidemia    low HDL  . HTN (hypertension)   . STEMI (ST elevation myocardial infarction) ([place]) 08/17/2015   DES LAD    Past Surgical History:  Procedure Laterality Date  . APPENDECTOMY    . Bilateral inguinal hernia surgery   03/23/2015  . CARDIAC CATHETERIZATION N/A 08/17/2015   Procedure: Left Heart Cath and Coronary Angiography;  Surgeon: [name], MD; LAD 99 & 40%, RCA 40%, EF 45-50%   . CARDIAC CATHETERIZATION Right 08/17/2015   Procedure: Coronary Stent Intervention;  Surgeon: [name], MD; mLAD 99%>0 w/ 3.5 x 22 mm Resolute DES; Laterality: Right     Allergies  Allergen Reactions  . Levofloxacin Anaphylaxis, Shortness Of Breath and Swelling  . Alpha-Gal Swelling  . Levaquin [Levofloxacin In D5w]   . Ace Inhibitors Cough    Current Outpatient Medications  Medication Sig Dispense Refill  . amLODipine (NORVASC) 5 MG tablet TAKE 1 TABLET BY MOUTH DAILY 90 tablet 3  . aspirin EC 81 MG tablet  Take 81 mg by mouth daily.    Marland Kitchen atorvastatin (LIPITOR) 80 MG tablet Take 1 tablet (80 mg total) by mouth daily at 6 PM. 30 tablet 5  . carvedilol (COREG) 6.25 MG tablet TAKE ONE TABLET TWICE DAILY 60 tablet 11  . EPINEPHrine 0.3 mg/0.3 mL IJ SOAJ injection     . KRILL OIL PO Take by mouth daily.    . Multiple Vitamin (MULTIVITAMIN WITH MINERALS) TABS tablet Take 1 tablet by mouth daily. Reported on 09/06/2015    . nitroGLYCERIN (NITROSTAT) 0.4 MG SL tablet DISSOLVE ONE TABLET UNDER THE TONGUE AS NEEDED FOR CHEST PAIN. MAY REPEAT AS INDICATED BY YOUR DOCTOR. 25 tablet 3  . selenium sulfide (SELSUN) 2.5 % shampoo Apply 1 application topically daily.  3   No current facility-administered medications for this visit.    Social History   Socioeconomic History  .  Marital status: Married    Spouse name: Not on file  . Number of children: Not on file  . Years of education: Not on file  . Highest education level: Not on file  Occupational History  . Not on file  Tobacco Use  . Smoking status: Never Smoker  . Smokeless tobacco: Never Used  Substance and Sexual Activity  . Alcohol use: No  . Drug use: Not on file  . Sexual activity: Not on file  Other Topics Concern  . Not on file  Social History Narrative  . 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  Family History  Problem Relation Age of Onset  . Heart attack Father        CABG in 71s    ROS General: Negative; No fevers, chills, or night sweats HEENT: Negative; No changes in vision or hearing, sinus congestion, difficulty swallowing; recent tooth cracking for which he underwent root canal.   Several recurrent episodes of lip swelling not responsive to antibiotics. Pulmonary: Negative; No cough, wheezing, shortness of breath, hemoptysis Cardiovascular: See HPI: No chest pain, presyncope, syncope, palpatations History of varicose veins GI: Negative; No nausea, vomiting, diarrhea, or abdominal pain GU: Bilateral inguinal hernia Musculoskeletal: Negative; no myalgias, joint pain, or weakness Hematologic: Negative; no easy bruising, bleeding Endocrine: Negative; no heat/cold intolerance; no diabetes, Neuro: Negative; no changes in balance, headaches Skin: Negative; No rashes or skin lesions Psychiatric: Negative; No behavioral problems, depression Sleep: Negative; No snoring,  daytime sleepiness, hypersomnolence, bruxism, restless legs, hypnogognic hallucinations. Other comprehensive 14 point system review is negative   Physical Exam BP 132/80   Pulse (!) 57   Ht 6' (1.829 m)   Wt 184 lb 12.8 oz (83.8 kg)   BMI 25.06 kg/m    Repeat blood pressure by me was 124/74  Wt Readings from Last 3 Encounters:  09/06/20 184 lb 12.8 oz (83.8 kg)  10/08/19 185 lb (83.9 kg)  10/02/18 188 lb (85.3 kg)   General: Alert, oriented, no distress.  Skin: normal turgor, no rashes, warm and dry HEENT: Normocephalic, atraumatic. Pupils equal round and reactive to light; sclera anicteric; extraocular muscles intact;  Nose without nasal septal hypertrophy Mouth/Parynx benign; Mallinpatti scale 2 Neck: No JVD, no carotid bruits; normal carotid upstroke Lungs: clear to ausculatation and percussion; no wheezing or rales Chest wall: Mild pectus excavatum Heart: PMI not displaced, RRR, s1 s2 normal, 1/6 systolic murmur, no diastolic murmur, no rubs, gallops, thrills, or heaves Abdomen: soft, nontender; no hepatosplenomehaly, BS+; abdominal aorta nontender and not dilated by palpation. Back: no CVA tenderness Pulses 2+ Musculoskeletal: full range of motion, normal strength, no joint  deformities Extremities: no clubbing cyanosis or edema, Homan's sign negative  Neurologic: grossly nonfocal; Cranial nerves grossly wnl Psychologic: Normal mood and affect   ECG (independently read by me): Sinus bradycardia at 57, LVH by voltage  February 2021 ECG (independently read by me): Sinus bradycardia 59 bpm.  No ectopy.  Normal intervals  January 2020 ECG (independently read by me): Normal sinus rhythm at 65 bpm.  No ectopy.  Normal intervals.  January 2018 ECG (independently read  by me): Normal sinus rhythm at 75 bpm.  No ectopy.  No significant ST changes.  No ECG evidence for prior MI  April 2017 ECG (independently read by me): Normal sinus rhythm at 61 bpm.  No ECG evidence for his recent myocardial infarction.  Nondiagnostic T-wave in lead aVL.  LABS:  BMP Latest Ref Rng & Units 11/25/2015 08/18/2015 08/17/2015  Glucose 65 - 99 mg/dL 87 169(H) 138(H)  BUN 7 - 25 mg/dL 21 12 18   Creatinine 0.70 - 1.33 mg/dL 1.07 1.02 1.21  Sodium 135 - 146 mmol/L 139 137 137  Potassium 3.5 - 5.3 mmol/L 4.5 3.7 3.8  Chloride 98 - 110 mmol/L 105 108 103  CO2 20 - 31 mmol/L 26 20(L) 25  Calcium 8.6 - 10.3 mg/dL 8.9 8.3(L) 9.1     Hepatic Function Latest Ref Rng & Units 11/25/2015  Total Protein 6.1 - 8.1 g/dL 6.9  Albumin 3.6 - 5.1 g/dL 4.0  AST 10 - 35 U/L 27  ALT 9 - 46 U/L 28  Alk Phosphatase 40 - 115 U/L 67  Total Bilirubin 0.2 - 1.2 mg/dL 0.8    CBC Latest Ref Rng & Units 08/18/2015 08/17/2015  WBC 4.0 - 10.5 K/uL 5.7 8.0  Hemoglobin 13.0 - 17.0 g/dL 11.8(L) 13.8  Hematocrit 39.0 - 52.0 % 34.5(L) 40.2  Platelets 150 - 400 K/uL 100(L) 185   Lab Results  Component Value Date   MCV 86.7 08/18/2015   MCV 86.5 08/17/2015    No results found for: TSH  BNP No results found for: BNP  ProBNP No results found for: PROBNP   Lipid Panel     Component Value Date/Time   CHOL 97 (L) 11/25/2015 0939   TRIG 70 11/25/2015 0939   HDL 36 (L) 11/25/2015 0939   CHOLHDL 2.7  11/25/2015 0939   VLDL 14 11/25/2015 0939   LDLCALC 47 11/25/2015 0939   Laboratory December 2019 from [name]: Cholesterol 104, LDL 52, HDL 38, triglycerides 80  Most recent laboratory by [name] in 2021 showed total cholesterol 108, LDL 57, HDL 39, triglycerides 59.  Hemoglobin A1c 5.5.  Creatinine 1.1.  RADIOLOGY: No results found.  IMPRESSION:  1. ACS (acute coronary syndrome) [facility]): August 17, 2015 treated with DES stent to LAD   2. Coronary artery disease involving native coronary artery of native heart without angina pectoris   3. Essential hypertension   4. Hyperlipidemia with target LDL less than 70    ASSESSMENT AND PLAN: Mr. Beau Ramsburg is a healthy-appearing 65 year old active gentleman who suffered an acute coronary syndrome on 08/17/2015.  Emergent catheterization revealed a 99% proximal LAD stenosis for which he underwent successful stenting with a DES stent.  He also was found to have 65% stenosis in his mid RCA for which medical therapy was recommended.  He has been documented have complete normalization of LV function.  He initially was maintained on carvedilol 6.25 mg twice a day in addition to losartan 50 mg.  He developed COVID-19 towards the end of 2020 and had fever for only 12 hours and was treated with prednisone and Z-Pak.  He was unaware of any oxygen desaturation and stayed home quarantined.  His wife also had it as well.  He has had several episodes of lip swelling and due to concerns for possible angioedema he was taken off his ARB therapy.  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  He  had not had need for some time but had inadvertently had some meat more recently and did not experience any reaction.  He has since had had several occasions of having me without recurrence of his lip swelling.  He continues to be active and does Peloton 7 days a week with a hard workout.  He denies exertional chest  tightness or shortness of breath or awareness of palpitations.  His blood pressure today is stable on his regimen now on amlodipine 5 mg in addition to carvedilol 6.25 mg twice a day.  He continues to be on atorvastatin 80 mg.  [name] at [facility] clinic had recently checkedaboratory and total cholesterol was 108, HDL 39, triglycerides 59, and LDL 57.  Creatinine was 1.1.  Hemoglobin A1c was 5.5.  Presently he is stable from a cardiovascular standpoint.  BMI is excellent.  He is no longer on DAPT but continues to be on aspirin alone without recurrent symptoms.  I will see him in 1 year for reevaluation or sooner as needed.  [name], MD, [facility] 09/08/2020 9:09 AM

## 2020-09-06 NOTE — Patient Instructions (Signed)
Medication Instructions:  No changes *If you need a refill on your cardiac medications before your next appointment, please call your pharmacy*   Lab Work: None ordered If you have labs (blood work) drawn today and your tests are completely normal, you will receive your results only by: . MyChart Message (if you have MyChart) OR . A paper copy in the mail If you have any lab test that is abnormal or we need to change your treatment, we will call you to review the results.   Testing/Procedures: None ordered   Follow-Up: At [facility], you and your health needs are our priority.  As part of our continuing mission to provide you with exceptional heart care, we have created designated Provider Care Teams.  These Care Teams include your primary Cardiologist (physician) and Advanced Practice Providers (APPs -  Physician Assistants and Nurse Practitioners) who all work together to provide you with the care you need, when you need it.  We recommend signing up for the patient portal called "MyChart".  Sign up information is provided on this After Visit Summary.  MyChart is used to connect with patients for Virtual Visits (Telemedicine).  Patients are able to view lab/test results, encounter notes, upcoming appointments, etc.  Non-urgent messages can be sent to your provider as well.   To learn more about what you can do with MyChart, go to [email or web address].    Your next appointment:   12 month(s)  The format for your next appointment:   In Person  Provider:   Thomas Kelly, MD   Other Instructions None  

## 2020-09-08 ENCOUNTER — Encounter: Payer: Self-pay | Admitting: Cardiovascular Disease

## 2020-09-22 ENCOUNTER — Other Ambulatory Visit: Payer: Self-pay | Admitting: Cardiovascular Disease

## 2020-10-21 ENCOUNTER — Other Ambulatory Visit: Payer: Self-pay | Admitting: Cardiovascular Disease

## 2020-12-19 ENCOUNTER — Other Ambulatory Visit: Payer: Self-pay

## 2020-12-19 MED ORDER — NITROGLYCERIN 0.4 MG SL SUBL: SUBLINGUAL_TABLET | SUBLINGUAL | 3 refills | Status: DC

## 2020-12-22 ENCOUNTER — Other Ambulatory Visit: Payer: Self-pay | Admitting: Cardiovascular Disease

## 2020-12-23 ENCOUNTER — Telehealth: Payer: Self-pay | Admitting: Cardiovascular Disease

## 2020-12-23 ENCOUNTER — Other Ambulatory Visit: Payer: Self-pay | Admitting: Cardiovascular Disease

## 2020-12-23 ENCOUNTER — Other Ambulatory Visit: Payer: Self-pay

## 2020-12-23 MED ORDER — CARVEDILOL 6.25 MG PO TABS: 6.2500 mg | ORAL_TABLET | Freq: Two times a day (BID) | ORAL | 3 refills | Status: DC

## 2020-12-23 NOTE — Telephone Encounter (Signed)
*  STAT* If patient is at the pharmacy, call can be transferred to refill team.   1. Which medications need to be refilled? (please list name of each medication and dose if known)  -carvedilol (COREG) 6.25 MG tablet  2. Which pharmacy/location (including street and city if local pharmacy) is medication to be sent to? [facility] - [place], [place] - [address]  3. Do they need a 30 day or 90 day supply?   90 day supply Patient states he just took his last tablet and is completely out of medication. He states he went to the pharmacy for a refill and they made him aware that he does not have any refills on his Carvedilol. Please assist.

## 2020-12-23 NOTE — Telephone Encounter (Signed)
Rx(s) sent to pharmacy electronically.  

## 2020-12-23 NOTE — Telephone Encounter (Signed)
Follow Up:     George Jimenez is checking on the status of his Carvedilol, he is completely out of it. Please call today, been without his medicine for 2 days.I

## 2021-06-22 ENCOUNTER — Other Ambulatory Visit: Payer: Self-pay | Admitting: Cardiovascular Disease

## 2021-06-22 DIAGNOSIS — I249 Acute ischemic heart disease, unspecified: Secondary | ICD-10-CM

## 2021-06-22 DIAGNOSIS — I10 Essential (primary) hypertension: Secondary | ICD-10-CM

## 2021-06-22 DIAGNOSIS — I2510 Atherosclerotic heart disease of native coronary artery without angina pectoris: Secondary | ICD-10-CM

## 2021-09-12 ENCOUNTER — Other Ambulatory Visit: Payer: Self-pay | Admitting: Cardiovascular Disease

## 2021-09-14 ENCOUNTER — Other Ambulatory Visit: Payer: Self-pay

## 2021-09-14 ENCOUNTER — Ambulatory Visit: Payer: No Typology Code available for payment source | Admitting: Cardiovascular Disease

## 2021-09-14 ENCOUNTER — Encounter: Payer: Self-pay | Admitting: Cardiovascular Disease

## 2021-09-14 VITALS — BP 120/72 | HR 63 | Ht 72.0 in | Wt 167.2 lb

## 2021-09-14 DIAGNOSIS — I2510 Atherosclerotic heart disease of native coronary artery without angina pectoris: Secondary | ICD-10-CM

## 2021-09-14 DIAGNOSIS — I10 Essential (primary) hypertension: Secondary | ICD-10-CM

## 2021-09-14 DIAGNOSIS — E785 Hyperlipidemia, unspecified: Secondary | ICD-10-CM | POA: Diagnosis not present

## 2021-09-14 DIAGNOSIS — I249 Acute ischemic heart disease, unspecified: Secondary | ICD-10-CM | POA: Diagnosis not present

## 2021-09-14 NOTE — Patient Instructions (Signed)
Medication Instructions:  The current medical regimen is effective;  continue present plan and medications as directed. Please refer to the Current Medication list given to you today.   *If you need a refill on your cardiac medications before your next appointment, please call your pharmacy*  Lab Work:   Testing/Procedures:  NONE    NONE  Follow-Up: Your next appointment:  12 month(s) In Person with Nicki Guadalajara, MD  Please call our office 2 months in advance to schedule this appointment   At [facility], you and your health needs are our priority.  As part of our continuing mission to provide you with exceptional heart care, we have created designated Provider Care Teams.  These Care Teams include your primary Cardiologist (physician) and Advanced Practice Providers (APPs -  Physician Assistants and Nurse Practitioners) who all work together to provide you with the care you need, when you need it.  We recommend signing up for the patient portal called "MyChart".  Sign up information is provided on this After Visit Summary.  MyChart is used to connect with patients for Virtual Visits (Telemedicine).  Patients are able to view lab/test results, encounter notes, upcoming appointments, etc.  Non-urgent messages can be sent to your provider as well.   To learn more about what you can do with MyChart, go to [email or web address].

## 2021-09-14 NOTE — Progress Notes (Signed)
Patient ID: George Jimenez, male   DOB: 1955/12/11, 66 y.o.   MRN: [id number]       PCP: [name]  HPI: George Jimenez is a 66 y.o. male who presents to the office today for a 12 month follow-up cardiology evaluation.  George Jimenez developed new onset chest discomfort while working out on 08/17/2015 and presented to [facility] emergency room.  His initial ECG was unremarkable, but he then developed profound hyperacute T waves with inferolateral ST depression in ST elevation anterolaterally.  A code STEMI was activated and he was transported to [facility] where he underwent emergent cardiac catheterization by me.  He was found to have mild acute LV dysfunction with an EF 45-50% and mild mid anterolateral hypocontractility.  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  His circumflex vessel was normal and ithere was smooth 20% proximal RCA narrowing of 60-70% mid stenosis between areas of ectasia.  He underwent successful PCI with DES stenting to the proximal LAD with ultimate insertion of a 3.522 mm Resolute DES stent postdilated 3.73 mm.  The 99% stenosis was reduced to 0%.  An ejection fraction done the day following his acute intervention revealed normalization of LV function with an ejection fraction of 60-65%.  There was mild residual hypokinesis of the anteroseptal wall.  There was grade 1 diastolic dysfunction.  Subsequently, he has done well.  He participated in the cardiac rehabilitation program.  He is now back to running and is running up to 4 miles and 32 minutes.  He denies any recurrent chest pain symptoms.  He denies palpitations.  He does have some issues with varicose veins.    Prior to undergoing bilateral hernia surgery I recommended that he undergo a  preoperative nuclear perfusion study in this was done on 02/07/2016.  On his study, the calculated ejection fraction was reduced, but it was not felt to be accurate and visually it appeared to be at least 50%.  He did not develop any ST segment changes during stress.  There was probable diaphragmatic attenuation artifact.  Of note, he had entirely normal perfusion in the LAD territory.  He tolerated his hernia surgery well from a cardiovascular standpoint and this was done on 03/22/2016 by Dr. [name].   I last saw him in January 2018 at which time he was continuing to exercise and run almost every day up to 2 2-1/2 miles on a treadmill.  At times he has noted some chest burning when he is lying down at night, but denies any exertionally precipitated chest pain or pressure or palpitations with activity.  This discomfort is different from his MI pain.  He tells me that his primary physician had recently checked his laboratory last month and his total cholesterol was 105, LDL 55, HDL 38, and platelet count was 139K.  I saw him in January 2020 at which time he remained stable.  He was no longer running but was using a Peloton bike for at least 30 to 45 minutes 7 days/week every morning.  He sees [name] at [facility] clinic and laboratory in December 2019 showed a total cholesterol 104, LDL 52, HDL 38, and triglycerides at 80.  He denied any recurrent chest pain, palpitations, presyncope or syncope.  Over the past year he has remained relatively stable.  Unfortunately he developed COVID-19 towards the end of 2020.  He lost his sense of taste alternations.  He only had a fever for 12 hours.  He had body aches for several days.  He was prescribed prednisone and a Z-Pak.  I last saw him in February 2021.  One month month prior to that evaluation his tooth that cracked and he subsequently developed swelling of his lip.  He initially was treated with antibiotics.  H  He had additional lip swelling on several  occasions in different parts of his lip.  He had contacted [name] who recommended discontinuance of losartan.  He has been off losartan for 3 days but with just given a prescription which he has not yet filled for olmesartan.  He continued to exercise daily on his bike and denied recurrent anginal symptoms or exertional dyspnea.    He subsequently had seen allergist, Dr. 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  He apparently stopped eating any meat.  Apparently several months later he inadvertently had some meat and had no reaction which raised out in his previous diagnosis.  Recently he has been eating meat intermittently without recurrent problems.  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  He recently had laboratory at the [facility] clinic with [name] and total cholesterol was 106 with LDL in the 30s.  He also sees Dr. Anda Latina.    Since I last saw him, he continues to be followed by [name].  He continues to be very active and does 20 to 30 mm of Peloton 6 to 7 days/week in addition to resistance training approximately 4 days/week.  He had undergone repeat laboratory in December 20 by his primary physician which showed total cholesterol 113, triglycerides 84, HDL 46 and LDL 49.  Hemoglobin A1c was 5.5.  Hemoglobin hematocrit were stable at 13.7/38.8, respectively.  Glucose was 85 and LFTs were normal.  Presently he denies any chest pain or palpitations.  She continues to be on aspirin 81 mg daily, carvedilol 6.25 mg twice a day and losartan 100 mg daily.  He is on atorvastatin 80 mg and is tolerating this well and uses over-the-counter Krill oil.  He presents for evaluation.  Past Medical History:  Diagnosis Date   Diverticulitis    Dyslipidemia    low HDL   HTN (hypertension)    STEMI (ST elevation myocardial infarction) ([place]) 08/17/2015   DES LAD    Past Surgical History:  Procedure Laterality  Date   APPENDECTOMY     Bilateral inguinal hernia surgery   03/23/2015   CARDIAC CATHETERIZATION N/A 08/17/2015   Procedure: Left Heart Cath and Coronary Angiography;  Surgeon: George Sine, MD; LAD 99 & 40%, RCA 40%, EF 45-50%    CARDIAC CATHETERIZATION Right 08/17/2015   Procedure: Coronary Stent Intervention;  Surgeon: George Sine, MD; mLAD 99%>0 w/ 3.5 x 22 mm Resolute DES; Laterality: Right     Allergies  Allergen Reactions   Levofloxacin Anaphylaxis, Shortness Of Breath and Swelling   Alpha-Gal Swelling   Levaquin [Levofloxacin In D5w]    Ace Inhibitors Cough    Current Outpatient Medications  Medication Sig Dispense Refill   aspirin EC 81 MG tablet Take 81 mg by mouth daily.     atorvastatin (LIPITOR) 80 MG tablet TAKE 1 TABLET BY MOUTH DAILY AT [facility]  30 tablet 4   carvedilol (COREG) 6.25 MG tablet TAKE ONE TABLET BY MOUTH TWICE DAILY 180 tablet 2   EPINEPHrine 0.3 mg/0.3 mL IJ SOAJ injection      KRILL OIL PO Take by mouth daily.     losartan (COZAAR) 100 MG tablet Take 100 mg by mouth daily.     Multiple Vitamin (MULTIVITAMIN WITH MINERALS) TABS tablet Take 1 tablet by mouth daily. Reported on 09/06/2015     nitroGLYCERIN (NITROSTAT) 0.4 MG SL tablet DISSOLVE ONE TABLET UNDER THE TONGUE AS NEEDED FOR CHEST PAIN. MAY REPEAT AS INDICATED BY YOUR DOCTOR. 25 tablet 3   selenium sulfide (SELSUN) 2.5 % shampoo Apply 1 application topically daily.  3   amLODipine (NORVASC) 5 MG tablet TAKE 1 TABLET BY MOUTH DAILY (Patient not taking: Reported on 09/14/2021) 90 tablet 3   No current facility-administered medications for this visit.    Social History   Socioeconomic History   Marital status: Married    Spouse name: Not on file   Number of children: Not on file   Years of education: Not on file   Highest education level: Not on file  Occupational History   Not on file  Tobacco Use   Smoking status: Never   Smokeless tobacco: Never  Substance and Sexual Activity   Alcohol  use: No   Drug use: Not on file   Sexual activity: Not on file  Other Topics Concern   Not on file  Social History Narrative   Not on file   Social Determinants of Health   Financial Resource Strain: Not on file  Food Insecurity: Not on file  Transportation Needs: Not on file  Physical Activity: Not on file  Stress: Not on file  Social Connections: Not on file  Intimate Partner Violence: Not on file   Additional social history is notable that 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  Family History  Problem Relation Age of Onset   Heart attack Father        CABG in 56s    ROS General: Negative; No fevers, chills, or night sweats HEENT: Negative; No changes in vision or hearing, sinus congestion, difficulty swallowing; recent tooth cracking for which he underwent root canal.  Several recurrent episodes of lip swelling not responsive to antibiotics. Pulmonary: Negative; No cough, wheezing, shortness of breath, hemoptysis Cardiovascular: See HPI: No chest pain, presyncope, syncope, palpatations History of varicose veins GI: Negative; No nausea, vomiting, diarrhea, or abdominal pain GU: Bilateral inguinal hernia Musculoskeletal: Negative; no myalgias, joint pain, or weakness Hematologic: Negative; no easy bruising, bleeding Endocrine: Negative; no heat/cold intolerance; no diabetes, Neuro: Negative; no changes in balance, headaches Skin: Negative; No rashes or skin lesions Psychiatric: Negative; No behavioral problems, depression Sleep: Negative; No snoring,  daytime sleepiness, hypersomnolence, bruxism, restless legs, hypnogognic hallucinations. Other comprehensive 14 point system review is negative   Physical Exam BP 120/72    Pulse 63    Ht 6' (1.829 m)    Wt 167 lb 3.2 oz (75.8 kg)    SpO2 99%    BMI 22.68 kg/m    Repeat blood pressure by me was 120/74  Wt Readings from Last 3 Encounters:  09/14/21 167 lb 3.2 oz (75.8 kg)  09/06/20 184 lb 12.8 oz  (83.8 kg)  10/08/19 185 lb (83.9 kg)   General: Alert, oriented, no distress.  Skin: normal turgor, no rashes, warm and dry HEENT: Normocephalic, atraumatic. Pupils equal round and reactive  to light; sclera anicteric; extraocular muscles intact;  Nose without nasal septal hypertrophy Mouth/Parynx benign; Mallinpatti scale 2 Neck: No JVD, no carotid bruits; normal carotid upstroke Lungs: clear to ausculatation and percussion; no wheezing or rales Chest wall: without tenderness to palpitation Heart: PMI not displaced, RRR, s1 s2 normal, 1/6 systolic murmur, no diastolic murmur, no rubs, gallops, thrills, or heaves Abdomen: soft, nontender; no hepatosplenomehaly, BS+; abdominal aorta nontender and not dilated by palpation. Back: no CVA tenderness Pulses 2+ Musculoskeletal: full range of motion, normal strength, no joint deformities Extremities: no clubbing cyanosis or edema, Homan's sign negative  Neurologic: grossly nonfocal; Cranial nerves grossly wnl Psychologic: Normal mood and affect   September 14, 2021 ECG (independently read by me):  NSR at 63; no ectopy    September 06, 2020 ECG (independently read by me): Sinus bradycardia at 57, LVH by voltage  February 2021 ECG (independently read by me): Sinus bradycardia 59 bpm.  No ectopy.  Normal intervals  January 2020 ECG (independently read by me): Normal sinus rhythm at 65 bpm.  No ectopy.  Normal intervals.  January 2018 ECG (independently read by me): Normal sinus rhythm at 75 bpm.  No ectopy.  No significant ST changes.  No ECG evidence for prior MI  April 2017 ECG (independently read by me): Normal sinus rhythm at 61 bpm.  No ECG evidence for his recent myocardial infarction.  Nondiagnostic T-wave in lead aVL.  LABS:  BMP Latest Ref Rng & Units 11/25/2015 08/18/2015 08/17/2015  Glucose 65 - 99 mg/dL 87 169(H) 138(H)  BUN 7 - 25 mg/dL 21 12 18   Creatinine 0.70 - 1.33 mg/dL 1.07 1.02 1.21  Sodium 135 - 146 mmol/L 139 137 137   Potassium 3.5 - 5.3 mmol/L 4.5 3.7 3.8  Chloride 98 - 110 mmol/L 105 108 103  CO2 20 - 31 mmol/L 26 20(L) 25  Calcium 8.6 - 10.3 mg/dL 8.9 8.3(L) 9.1     Hepatic Function Latest Ref Rng & Units 11/25/2015  Total Protein 6.1 - 8.1 g/dL 6.9  Albumin 3.6 - 5.1 g/dL 4.0  AST 10 - 35 U/L 27  ALT 9 - 46 U/L 28  Alk Phosphatase 40 - 115 U/L 67  Total Bilirubin 0.2 - 1.2 mg/dL 0.8    CBC Latest Ref Rng & Units 08/18/2015 08/17/2015  WBC 4.0 - 10.5 K/uL 5.7 8.0  Hemoglobin 13.0 - 17.0 g/dL 11.8(L) 13.8  Hematocrit 39.0 - 52.0 % 34.5(L) 40.2  Platelets 150 - 400 K/uL 100(L) 185   Lab Results  Component Value Date   MCV 86.7 08/18/2015   MCV 86.5 08/17/2015    No results found for: TSH  BNP No results found for: BNP  ProBNP No results found for: PROBNP   Lipid Panel     Component Value Date/Time   CHOL 97 (L) 11/25/2015 0939   TRIG 70 11/25/2015 0939   HDL 36 (L) 11/25/2015 0939   CHOLHDL 2.7 11/25/2015 0939   VLDL 14 11/25/2015 0939   LDLCALC 47 11/25/2015 0939   Laboratory December 2019 from [name]: Cholesterol 104, LDL 52, HDL 38, triglycerides 80  Most recent laboratory by [name] in 2021 showed total cholesterol 108, LDL 57, HDL 39, triglycerides 59.  Hemoglobin A1c 5.5.  Creatinine 1.1.  RADIOLOGY: No results found.  IMPRESSION:  1. ACS (acute coronary syndrome) [facility]): August 17, 2015 treated with DES stent to LAD   2. Coronary artery disease involving native coronary artery of native heart without angina  pectoris   3. Primary hypertension   4. Hyperlipidemia with target LDL less than 70     ASSESSMENT AND PLAN: [name] is a healthy-appearing 65 year-old active gentleman who suffered an acute coronary syndrome on 08/17/2015.  Emergent catheterization revealed a 99% proximal LAD stenosis for which he underwent successful stenting with a DES stent.  He also was found to have 65% stenosis in his mid RCA for which medical therapy was  recommended.  He has been documented have complete normalization of LV function.  He initially was maintained on carvedilol 6.25 mg twice a day in addition to losartan 50 mg.  He developed COVID-19 towards the end of 2020 and had fever for only 12 hours and was treated with prednisone and Z-Pak.  He was unaware of any oxygen desaturation and stayed home quarantined.  His wife also had it as well.  He has had several episodes of lip swelling and due to concerns for possible angioedema he was taken off his ARB therapy.  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  He had not had need for some time but had inadvertently had some meat more recently and did not experience any reaction.  He has since had had several occasions of having me without recurrence of his lip swelling.  Presently, he continues to be very active.  He continues to do Peloton for at least 20 to 30 minutes of high intensity at least 6 to 7 days/week in addition to lifting weights approximately 4 to 5 days/week.  He denies any exertional precipitation of chest pain or change in exercise tolerance.  He is unaware of any exercise-induced palpitations.  Lipids have been aggressively treated and he continues to be on atorvastatin 80 mg daily his most recent lipid panel on August 22, 2021 showing total cholesterol 113, triglycerides 84, HDL 46, LDL 49.  Hemoglobin A1c is excellent at 5.5.  He has normal LFTs.  His blood pressure today is stable on carvedilol 6.25 mg twice a day in addition to losartan 100 mg daily.  He is no longer taking amlodipine.  He is on baby aspirin 81 mg daily.  He will continue current therapy with plans for follow-up with [name] at the [facility].  I will see him in 1 year for cardiology reevaluation.  George Sine, MD, [facility] 10/01/2021 10:42 AM

## 2021-10-01 ENCOUNTER — Encounter: Payer: Self-pay | Admitting: Cardiovascular Disease

## 2022-06-05 DIAGNOSIS — R0981 Nasal congestion: Secondary | ICD-10-CM | POA: Diagnosis not present

## 2022-06-05 DIAGNOSIS — J329 Chronic sinusitis, unspecified: Secondary | ICD-10-CM | POA: Diagnosis not present

## 2022-06-05 DIAGNOSIS — J309 Allergic rhinitis, unspecified: Secondary | ICD-10-CM | POA: Diagnosis not present

## 2022-06-07 DIAGNOSIS — J329 Chronic sinusitis, unspecified: Secondary | ICD-10-CM | POA: Diagnosis not present

## 2022-06-20 ENCOUNTER — Other Ambulatory Visit: Payer: Self-pay | Admitting: Cardiovascular Disease

## 2022-07-25 DIAGNOSIS — K5792 Diverticulitis of intestine, part unspecified, without perforation or abscess without bleeding: Secondary | ICD-10-CM | POA: Diagnosis not present

## 2022-08-03 ENCOUNTER — Other Ambulatory Visit: Payer: Self-pay | Admitting: Cardiovascular Disease

## 2022-09-24 NOTE — Progress Notes (Signed)
Cardiology Clinic Note   Patient Name: George Jimenez Date of Encounter: 09/25/2022  Primary Care Provider:  Rusty Aus, MD Primary Cardiologist:  Shelva Majestic, MD  Patient Profile    George Jimenez 67 year old male presents to the clinic today for follow-up evaluation of his coronary artery disease, and hypertension.  Past Medical History    Past Medical History:  Diagnosis Date   Diverticulitis    Dyslipidemia    low HDL   HTN (hypertension)    STEMI (ST elevation myocardial infarction) ([place]) 08/17/2015   DES LAD   Past Surgical History:  Procedure Laterality Date   APPENDECTOMY     Bilateral inguinal hernia surgery   03/23/2015   CARDIAC CATHETERIZATION N/A 08/17/2015   Procedure: Left Heart Cath and Coronary Angiography;  Surgeon: [name], MD; LAD 66 & 40%, RCA 40%, EF 45-50%    CARDIAC CATHETERIZATION Right 08/17/2015   Procedure: Coronary Stent Intervention;  Surgeon: [name], MD; mLAD 99%>0 w/ 3.5 x 22 mm Resolute DES; Laterality: Right     Allergies  Allergies  Allergen Reactions   Levofloxacin Anaphylaxis, Shortness Of Breath and Swelling   Alpha-Gal Swelling   Levaquin [Levofloxacin In D5w]    Ace Inhibitors Cough    History of Present Illness    George Jimenez diverticulitis, hyperlipidemia, HTN, coronary artery disease.  He developed chest discomfort while working out on 12/16.  He presented to [facility] emergency department.  His EKG was unremarkable.  He then developed hyperacute T waves with inferior lateral ST depression.  A code STEMI was activated.  He underwent emergent catheterization and was found to have mild acute LV dysfunction 45-50%, mild mid anterolateral hypocontractibility.  He was noted to have two-vessel CAD with 20% smooth ostial narrowing of the LAD and 99% proximal LAD.  His circumflex was normal and he was noted to have proximal 20% RCA narrowing with 60-70% mid stenosis.  He underwent successful PCI with DES to his  proximal LAD.  He contracted COVID-19 at the end of 2020.  He lost his sense of taste.  He had fever for 12 hours.  He noted body aches for several days.  He was prescribed prednisone and Z-Pak.  He was last seen by Dr. Claiborne Billings on 09/14/2021.  During that time he continued to be followed by his PCP [name].  He was very physically active using his Peloton bike 30 minutes/day for 6 to 7 days/week.  He also did resistance training 4 days/week.  He denied chest pain and palpitations.  He presents to the clinic today for follow-up evaluation states he continues to feel well.  He brings in his lab work today which shows a total cholesterol of 116, triglycerides 62, HDL of 46 and LDL of 60.  Previously his LDL was 49.  He reports some higher cholesterol foods over the holidays including prime rib for Christmas instead of [place] this year.  We reviewed the importance of maintaining his physical activity and increasing the fiber in his diet.  He does have a well-balanced diet and will work on increasing his fiber.  He continues to be very physically active riding his stationary bike and doing resistance training.  Will plan follow-up in 12 months.  I will refill his cardiac medications.  Today he denies chest pain, shortness of breath, lower extremity edema, fatigue, palpitations, melena, hematuria, hemoptysis, diaphoresis, weakness, presyncope, syncope, orthopnea, and PND.   Home Medications    Prior to Admission medications  Medication Sig Start Date End Date Taking? Authorizing Provider  amLODipine (NORVASC) 5 MG tablet TAKE 1 TABLET BY MOUTH DAILY Patient not taking: Reported on 09/14/2021 06/22/21   Lennette Bihari, MD  aspirin EC 81 MG tablet Take 81 mg by mouth daily.    [provider]  atorvastatin (LIPITOR) 80 MG tablet TAKE 1 TABLET BY MOUTH DAILY AT 6PM 08/03/22   Lennette Bihari, MD  carvedilol (COREG) 6.25 MG tablet TAKE ONE TABLET BY MOUTH TWICE DAILY 06/20/22   Lennette Bihari, MD   EPINEPHrine 0.3 mg/0.3 mL IJ SOAJ injection  11/02/19   [provider]  KRILL OIL PO Take by mouth daily.    [provider]  losartan (COZAAR) 100 MG tablet Take 100 mg by mouth daily. 06/28/21   [provider]  Multiple Vitamin (MULTIVITAMIN WITH MINERALS) TABS tablet Take 1 tablet by mouth daily. Reported on 09/06/2015    [provider]  nitroGLYCERIN (NITROSTAT) 0.4 MG SL tablet DISSOLVE ONE TABLET UNDER THE TONGUE AS NEEDED FOR CHEST PAIN. MAY REPEAT AS INDICATED BY YOUR DOCTOR. 12/19/20   Lennette Bihari, MD  selenium sulfide (SELSUN) 2.5 % shampoo Apply 1 application topically daily. 12/07/15   [provider]    Family History    Family History  Problem Relation Age of Onset   Heart attack Father        CABG in 58s   He indicated that the status of his father is unknown.  Social History    Social History   Socioeconomic History   Marital status: Married    Spouse name: Not on file   Number of children: Not on file   Years of education: Not on file   Highest education level: Not on file  Occupational History   Not on file  Tobacco Use   Smoking status: Never   Smokeless tobacco: Never  Substance and Sexual Activity   Alcohol use: No   Drug use: Not on file   Sexual activity: Not on file  Other Topics Concern   Not on file  Social History Narrative   Not on file   Social Determinants of Health   Financial Resource Strain: Not on file  Food Insecurity: Not on file  Transportation Needs: Not on file  Physical Activity: Not on file  Stress: Not on file  Social Connections: Not on file  Intimate Partner Violence: Not on file     Review of Systems    General:  No chills, fever, night sweats or weight changes.  Cardiovascular:  No chest pain, dyspnea on exertion, edema, orthopnea, palpitations, paroxysmal nocturnal dyspnea. Dermatological: No rash, lesions/masses Respiratory: No cough, dyspnea Urologic: No hematuria,  dysuria Abdominal:   No nausea, vomiting, diarrhea, bright red blood per rectum, melena, or hematemesis Neurologic:  No visual changes, wkns, changes in mental status. All other systems reviewed and are otherwise negative except as noted above.  Physical Exam    VS:  BP 128/80 (BP Location: Right Arm, Patient Position: Sitting, Cuff Size: Normal)   Pulse (!) 53   Ht 6' (1.829 m)   Wt 176 lb (79.8 kg)   BMI 23.87 kg/m  , BMI Body mass index is 23.87 kg/m. GEN: Well nourished, well developed, in no acute distress. HEENT: normal. Neck: Supple, no JVD, carotid bruits, or masses. Cardiac: RRR, no murmurs, rubs, or gallops. No clubbing, cyanosis, edema.  Radials/DP/PT 2+ and equal bilaterally.  Respiratory:  Respirations regular and unlabored, clear  to auscultation bilaterally. GI: Soft, nontender, nondistended, BS + x 4. MS: no deformity or atrophy. Skin: warm and dry, no rash. Neuro:  Strength and sensation are intact. Psych: Normal affect.  Accessory Clinical Findings    Recent Labs: No results found for requested labs within last 365 days.   Recent Lipid Panel    Component Value Date/Time   CHOL 97 (L) 11/25/2015 0939   TRIG 70 11/25/2015 0939   HDL 36 (L) 11/25/2015 0939   CHOLHDL 2.7 11/25/2015 0939   VLDL 14 11/25/2015 0939   LDLCALC 47 11/25/2015 0939         ECG personally reviewed by me today-sinus bradycardia minimal voltage criteria for LVH 53 bpm- No acute changes  Echocardiogram 08/18/2015  Study Conclusions   - Left ventricle: The cavity size was normal. Wall thickness was    increased in a pattern of mild LVH. Systolic function was normal.    The estimated ejection fraction was in the range of 60% to 65%.    There is mild hypokinesis of the entireanteroseptal myocardium.    Doppler parameters are consistent with abnormal left ventricular    relaxation (grade 1 diastolic dysfunction).   Transthoracic echocardiography.  M-mode, complete 2D, spectral   Doppler, and color Doppler.  Birthdate:  Patient birthdate:  1956-02-23.  Age:  Patient is 67 yr old.  Sex:  Gender: male.  BMI: 26.3 kg/m^2.  Blood pressure:     129/83  Patient status:  Inpatient.  Study date:  Study date: 08/18/2015. Study time: 11:13  AM.  Location:  ICU/CCU   -------------------------------------------------------------------   -------------------------------------------------------------------  Left ventricle:  The cavity size was normal. Wall thickness was  increased in a pattern of mild LVH. Systolic function was normal.  The estimated ejection fraction was in the range of 60% to 65%.  Regional wall motion abnormalities:   There is mild hypokinesis of  the entireanteroseptal myocardium. Doppler parameters are  consistent with abnormal left ventricular relaxation (grade 1  diastolic dysfunction).   -------------------------------------------------------------------  Aortic valve:   Structurally normal valve.   Cusp separation was  normal.  Doppler:  Transvalvular velocity was within the normal  range. There was no stenosis. There was no regurgitation.   -------------------------------------------------------------------  Aorta: Aortic root mildly dilated.   -------------------------------------------------------------------  Mitral valve:   Structurally normal valve.   Leaflet separation was  normal.  Doppler:  Transvalvular velocity was within the normal  range. There was no evidence for stenosis. There was no  regurgitation.   -------------------------------------------------------------------  Left atrium:  The atrium was normal in size.   -------------------------------------------------------------------  Right ventricle:  The cavity size was normal. Wall thickness was  normal. Systolic function was normal.   -------------------------------------------------------------------  Pulmonic valve:    The valve appears to be grossly normal.  Doppler:   There was no significant regurgitation.   -------------------------------------------------------------------  Tricuspid valve:   Structurally normal valve.   Leaflet separation  was normal.  Doppler:  Transvalvular velocity was within the normal  range. There was mild regurgitation.   -------------------------------------------------------------------  Right atrium:  The atrium was at the upper limits of normal in  size.   -------------------------------------------------------------------  Pericardium: There was no pericardial effusion.    -------------------------------------------------------------------  Post procedure conclusions  Ascending Aorta:   - Aortic root mildly dilated.   Nuclear stress test 02/07/2016  Nuclear stress EF is calculated at 37% but visually appears to be at least 50%. There was no ST segment deviation noted during  stress. Blood pressure demonstrated a hypertensive response to exercise. There is a medium defect of mild severity present in the basal inferoseptal, basal inferior, mid inferoseptal and mid inferior location. The defect is non-reversible and consistent with diaphragmatic attenuation artifact and extracardiac activity. No ischemia noted. The left ventricular ejection fraction is moderately decreased (30-44%). This is a low risk study.  Assessment & Plan   1.  Coronary artery disease-no chest pain today.  Underwent cardiac catheterization with PCI and DES to his LAD 2016. Continue amlodipine, aspirin, atorvastatin, carvedilol, losartan Heart healthy low-sodium diet-salty 6 given Increase physical activity as tolerated  Essential hypertension-BP today 128/80 Continue losartan, amlodipine, carvedilol Heart healthy low-sodium diet-salty 6 given Increase physical activity as tolerated  Hyperlipidemia-LDL 60 on 08/23/22.  Reviewed the importance of maintaining high-fiber and reducing cholesterol in diet.  Previous LDL was 49.  He reports that he  has had some higher cholesterol foods recently in his diet. Continue atorvastatin, aspirin Heart healthy low-sodium high-fiber diet Increase physical activity as tolerated   Disposition: Follow-up with Dr. Claiborne Billings in 12 months.   George Ng. Kynisha Memon NP-C     09/25/2022, 10:50 AM [place] [address] Office [phone] Fax [phone]    I spent 30minutes examining this patient, reviewing medications, and using patient centered shared decision making involving her cardiac care.  Prior to her visit I spent greater than 20 minutes reviewing her past medical history,  medications, and prior cardiac tests.

## 2022-09-25 ENCOUNTER — Encounter: Payer: Self-pay | Admitting: General Practice

## 2022-09-25 ENCOUNTER — Ambulatory Visit: Payer: No Typology Code available for payment source | Attending: General Practice | Admitting: General Practice

## 2022-09-25 VITALS — BP 128/80 | HR 53 | Ht 72.0 in | Wt 176.0 lb

## 2022-09-25 DIAGNOSIS — E785 Hyperlipidemia, unspecified: Secondary | ICD-10-CM | POA: Diagnosis not present

## 2022-09-25 DIAGNOSIS — I10 Essential (primary) hypertension: Secondary | ICD-10-CM | POA: Diagnosis not present

## 2022-09-25 DIAGNOSIS — I2510 Atherosclerotic heart disease of native coronary artery without angina pectoris: Secondary | ICD-10-CM | POA: Diagnosis not present

## 2022-09-25 NOTE — Patient Instructions (Signed)
Medication Instructions:  The current medical regimen is effective;  continue present plan and medications as directed. Please refer to the Current Medication list given to you today.  *If you need a refill on your cardiac medications before your next appointment, please call your pharmacy*  Lab Work: NONE If you have labs (blood work) drawn today and your tests are completely normal, you will receive your results only by:  [place] (if you have MyChart) OR  A paper copy in the mail If you have any lab test that is abnormal or we need to change your treatment, we will call you to review the results.  Other Instructions PLEASE READ AND FOLLOW ATTACHED  SALTY 6    LIMIT HIGH CHOLESTEROL FOODS  INCREASED FIBER DIET-ATTACHED  Follow-Up: At [facility], you and your health needs are our priority.  As part of our continuing mission to provide you with exceptional heart care, we have created designated Provider Care Teams.  These Care Teams include your primary Cardiologist (physician) and Advanced Practice Providers (APPs -  Physician Assistants and Nurse Practitioners) who all work together to provide you with the care you need, when you need it.  We recommend signing up for the patient portal called "MyChart".  Sign up information is provided on this After Visit Summary.  MyChart is used to connect with patients for Virtual Visits (Telemedicine).  Patients are able to view lab/test results, encounter notes, upcoming appointments, etc.  Non-urgent messages can be sent to your provider as well.   To learn more about what you can do with MyChart, go to [email or web address].    Your next appointment:   12 month(s)  Provider:   Shelva Majestic, MD       High-Fiber Eating Plan Fiber, also called dietary fiber, is a type of carbohydrate. It is found foods such as fruits, vegetables, whole grains, and beans. A high-fiber diet can have many health benefits. Your health care  provider may recommend a high-fiber diet to help: Prevent constipation. Fiber can make your bowel movements more regular. Lower your cholesterol. Relieve the following conditions: Inflammation of veins in the anus (hemorrhoids). Inflammation of specific areas of the digestive tract (uncomplicated diverticulosis). A problem of the large intestine, also called the colon, that sometimes causes pain and diarrhea (irritable bowel syndrome, or IBS). Prevent overeating as part of a weight-loss plan. Prevent heart disease, type 2 diabetes, and certain cancers. What are tips for following this plan? Reading food labels  Check the nutrition facts label on food products for the amount of dietary fiber. Choose foods that have 5 grams of fiber or more per serving. The goals for recommended daily fiber intake include: Men (age 29 or younger): 34-38 g. Men (over age 89): 28-34 g. Women (age 6 or younger): 25-28 g. Women (over age 56): 22-25 g. Your daily fiber goal is _____________ g. Shopping Choose whole fruits and vegetables instead of processed forms, such as apple juice or applesauce. Choose a wide variety of high-fiber foods such as avocados, lentils, oats, and kidney beans. Read the nutrition facts label of the foods you choose. Be aware of foods with added fiber. These foods often have high sugar and sodium amounts per serving. Cooking Use whole-grain flour for baking and cooking. Cook with brown rice instead of white rice. Meal planning Start the day with a breakfast that is high in fiber, such as a cereal that contains 5 g of fiber or more per serving. Eat breads and  cereals that are made with whole-grain flour instead of refined flour or white flour. Eat brown rice, bulgur wheat, or millet instead of white rice. Use beans in place of meat in soups, salads, and pasta dishes. Be sure that half of the grains you eat each day are whole grains. General information You can get the recommended  daily intake of dietary fiber by: Eating a variety of fruits, vegetables, grains, nuts, and beans. Taking a fiber supplement if you are not able to take in enough fiber in your diet. It is better to get fiber through food than from a supplement. Gradually increase how much fiber you consume. If you increase your intake of dietary fiber too quickly, you may have bloating, cramping, or gas. Drink plenty of water to help you digest fiber. Choose high-fiber snacks, such as berries, raw vegetables, nuts, and popcorn. What foods should I eat? Fruits Berries. Pears. Apples. Oranges. Avocado. Prunes and raisins. Dried figs. Vegetables Sweet potatoes. Spinach. Kale. Artichokes. Cabbage. Broccoli. Cauliflower. Green peas. Carrots. Squash. Grains Whole-grain breads. Multigrain cereal. Oats and oatmeal. Brown rice. Barley. Bulgur wheat. [place]. Quinoa. Bran muffins. Popcorn. Rye wafer crackers. Meats and other proteins Navy beans, kidney beans, and pinto beans. Soybeans. Split peas. Lentils. Nuts and seeds. Dairy Fiber-fortified yogurt. Beverages Fiber-fortified soy milk. Fiber-fortified orange juice. Other foods Fiber bars. The items listed above may not be a complete list of recommended foods and beverages. Contact a dietitian for more information. What foods should I avoid? Fruits Fruit juice. Cooked, strained fruit. Vegetables Fried potatoes. Canned vegetables. Well-cooked vegetables. Grains White bread. Pasta made with refined flour. White rice. Meats and other proteins Fatty cuts of meat. Fried chicken or fried fish. Dairy Milk. Yogurt. Cream cheese. Sour cream. Fats and oils Butters. Beverages Soft drinks. Other foods Cakes and pastries. The items listed above may not be a complete list of foods and beverages to avoid. Talk with your dietitian about what choices are best for you. Summary Fiber is a type of carbohydrate. It is found in foods such as fruits, vegetables, whole  grains, and beans. A high-fiber diet has many benefits. It can help to prevent constipation, lower blood cholesterol, aid weight loss, and reduce your risk of heart disease, diabetes, and certain cancers. Increase your intake of fiber gradually. Increasing fiber too quickly may cause cramping, bloating, and gas. Drink plenty of water while you increase the amount of fiber you consume. The best sources of fiber include whole fruits and vegetables, whole grains, nuts, seeds, and beans. This information is not intended to replace advice given to you by your health care provider. Make sure you discuss any questions you have with your health care provider. Document Revised: 12/24/2019 Document Reviewed: 12/24/2019 Elsevier Patient Education  [place].

## 2022-09-26 DIAGNOSIS — K5792 Diverticulitis of intestine, part unspecified, without perforation or abscess without bleeding: Secondary | ICD-10-CM | POA: Diagnosis not present

## 2022-11-22 ENCOUNTER — Other Ambulatory Visit: Payer: Self-pay

## 2022-11-22 MED ORDER — ATORVASTATIN CALCIUM 80 MG PO TABS: ORAL_TABLET | ORAL | 1 refills | Status: DC

## 2022-11-22 NOTE — Telephone Encounter (Signed)
Refill medication sent --------------------------- George Pelton, NP  George Rocher, LPN Please refill atorvastatin.  Thank you.  George Ng. Cleaver NP-C 11/22/2022, 2:06 PM [place] [address] Office [phone] Fax [phone]  -------------------------------------- Previous Messages ----- Message ----- From: George Jimenez, CPhT Sent: 11/22/2022   1:54 PM EDT To: George Pelton, NP Subject: Medication Adherence                          Hi George Jimenez is currently prescribed  Atorvastatin and is past due for a refill.  Last fill date was 2/20 30DS.   Adherence rate for the year is 82%.   If appropriate, please consider sending in a new prescription for Atorvastatin with extended day supply of 90 days to help improve adherence.  Thank you!  George Jimenez, George Jimenez [phone]

## 2022-12-12 DIAGNOSIS — D2261 Melanocytic nevi of right upper limb, including shoulder: Secondary | ICD-10-CM | POA: Diagnosis not present

## 2022-12-12 DIAGNOSIS — L570 Actinic keratosis: Secondary | ICD-10-CM | POA: Diagnosis not present

## 2022-12-12 DIAGNOSIS — D2271 Melanocytic nevi of right lower limb, including hip: Secondary | ICD-10-CM | POA: Diagnosis not present

## 2022-12-12 DIAGNOSIS — Z85828 Personal history of other malignant neoplasm of skin: Secondary | ICD-10-CM | POA: Diagnosis not present

## 2022-12-12 DIAGNOSIS — D225 Melanocytic nevi of trunk: Secondary | ICD-10-CM | POA: Diagnosis not present

## 2023-01-16 DIAGNOSIS — T466X5A Adverse effect of antihyperlipidemic and antiarteriosclerotic drugs, initial encounter: Secondary | ICD-10-CM | POA: Diagnosis not present

## 2023-01-16 DIAGNOSIS — M791 Myalgia, unspecified site: Secondary | ICD-10-CM | POA: Diagnosis not present

## 2023-02-05 DIAGNOSIS — H60543 Acute eczematoid otitis externa, bilateral: Secondary | ICD-10-CM | POA: Diagnosis not present

## 2023-02-05 DIAGNOSIS — H6123 Impacted cerumen, bilateral: Secondary | ICD-10-CM | POA: Diagnosis not present

## 2023-02-12 DIAGNOSIS — E782 Mixed hyperlipidemia: Secondary | ICD-10-CM | POA: Diagnosis not present

## 2023-02-12 DIAGNOSIS — T466X5A Adverse effect of antihyperlipidemic and antiarteriosclerotic drugs, initial encounter: Secondary | ICD-10-CM | POA: Diagnosis not present

## 2023-02-12 DIAGNOSIS — M5010 Cervical disc disorder with radiculopathy, unspecified cervical region: Secondary | ICD-10-CM | POA: Diagnosis not present

## 2023-02-12 DIAGNOSIS — M791 Myalgia, unspecified site: Secondary | ICD-10-CM | POA: Diagnosis not present

## 2023-03-14 DIAGNOSIS — H02831 Dermatochalasis of right upper eyelid: Secondary | ICD-10-CM | POA: Diagnosis not present

## 2023-03-14 DIAGNOSIS — E782 Mixed hyperlipidemia: Secondary | ICD-10-CM | POA: Diagnosis not present

## 2023-05-14 DIAGNOSIS — H903 Sensorineural hearing loss, bilateral: Secondary | ICD-10-CM | POA: Diagnosis not present

## 2023-05-14 DIAGNOSIS — H6123 Impacted cerumen, bilateral: Secondary | ICD-10-CM | POA: Diagnosis not present

## 2023-06-11 ENCOUNTER — Telehealth: Payer: Self-pay | Admitting: Cardiovascular Disease

## 2023-06-11 MED ORDER — CARVEDILOL 6.25 MG PO TABS: 6.2500 mg | ORAL_TABLET | Freq: Two times a day (BID) | ORAL | 0 refills | Status: DC

## 2023-06-11 NOTE — Telephone Encounter (Signed)
Pt's medication was sent to pt's pharmacy as requested. Confirmation received.  °

## 2023-06-11 NOTE — Telephone Encounter (Signed)
*  STAT* If patient is at the pharmacy, call can be transferred to refill team.   1. Which medications need to be refilled? (please list name of each medication and dose if known)  carvedilol (COREG) 6.25 MG tablet   TAKE ONE TABLET BY MOUTH TWICE DAILY   2. Would you like to learn more about the convenience, safety, & potential cost savings by using the [facility]? No   3. Are you open to using the [facility] No   4. Which pharmacy/location (including street and city if local pharmacy) is medication to be sent to?[facility] - [place], [place] - [address]    5. Do they need a 30 day or 90 day supply? 90 Day Supply

## 2023-07-11 DIAGNOSIS — E785 Hyperlipidemia, unspecified: Secondary | ICD-10-CM | POA: Diagnosis not present

## 2023-07-11 DIAGNOSIS — Z6823 Body mass index (BMI) 23.0-23.9, adult: Secondary | ICD-10-CM | POA: Diagnosis not present

## 2023-07-11 DIAGNOSIS — Z008 Encounter for other general examination: Secondary | ICD-10-CM | POA: Diagnosis not present

## 2023-07-11 DIAGNOSIS — I10 Essential (primary) hypertension: Secondary | ICD-10-CM | POA: Diagnosis not present

## 2023-07-16 DIAGNOSIS — K130 Diseases of lips: Secondary | ICD-10-CM | POA: Diagnosis not present

## 2023-07-17 DIAGNOSIS — D3701 Neoplasm of uncertain behavior of lip: Secondary | ICD-10-CM | POA: Diagnosis not present

## 2023-08-06 DIAGNOSIS — H903 Sensorineural hearing loss, bilateral: Secondary | ICD-10-CM | POA: Diagnosis not present

## 2023-08-06 DIAGNOSIS — J019 Acute sinusitis, unspecified: Secondary | ICD-10-CM | POA: Diagnosis not present

## 2023-08-06 DIAGNOSIS — H6123 Impacted cerumen, bilateral: Secondary | ICD-10-CM | POA: Diagnosis not present

## 2023-08-27 ENCOUNTER — Encounter: Payer: Self-pay | Admitting: Cardiovascular Disease

## 2023-09-02 DIAGNOSIS — E782 Mixed hyperlipidemia: Secondary | ICD-10-CM | POA: Diagnosis not present

## 2023-09-02 DIAGNOSIS — Z125 Encounter for screening for malignant neoplasm of prostate: Secondary | ICD-10-CM | POA: Diagnosis not present

## 2023-09-02 DIAGNOSIS — M5010 Cervical disc disorder with radiculopathy, unspecified cervical region: Secondary | ICD-10-CM | POA: Diagnosis not present

## 2023-09-02 DIAGNOSIS — R972 Elevated prostate specific antigen [PSA]: Secondary | ICD-10-CM | POA: Diagnosis not present

## 2023-09-02 DIAGNOSIS — Z0000 Encounter for general adult medical examination without abnormal findings: Secondary | ICD-10-CM | POA: Diagnosis not present

## 2023-09-02 DIAGNOSIS — Z79899 Other long term (current) drug therapy: Secondary | ICD-10-CM | POA: Diagnosis not present

## 2023-09-06 ENCOUNTER — Other Ambulatory Visit: Payer: Self-pay | Admitting: Internal Medicine

## 2023-09-06 DIAGNOSIS — M5010 Cervical disc disorder with radiculopathy, unspecified cervical region: Secondary | ICD-10-CM

## 2023-09-13 ENCOUNTER — Ambulatory Visit: Payer: No Typology Code available for payment source | Admitting: Cardiovascular Disease

## 2023-09-13 ENCOUNTER — Ambulatory Visit
Admission: RE | Admit: 2023-09-13 | Discharge: 2023-09-13 | Disposition: A | Discharge status: Home / Self Care | Payer: Commercial Managed Care - PPO | Source: Ambulatory Visit | Attending: Internal Medicine | Admitting: Internal Medicine

## 2023-09-13 DIAGNOSIS — M5010 Cervical disc disorder with radiculopathy, unspecified cervical region: Secondary | ICD-10-CM | POA: Insufficient documentation

## 2023-09-18 ENCOUNTER — Other Ambulatory Visit: Payer: Self-pay | Admitting: General Practice

## 2023-09-20 ENCOUNTER — Other Ambulatory Visit (HOSPITAL_COMMUNITY): Payer: Self-pay

## 2023-09-20 ENCOUNTER — Encounter (HOSPITAL_COMMUNITY): Payer: Self-pay

## 2023-09-20 NOTE — Telephone Encounter (Signed)
 error

## 2023-09-26 ENCOUNTER — Inpatient Hospital Stay
Admission: RE | Admit: 2023-09-26 | Discharge: 2023-09-26 | Disposition: A | Discharge status: Home / Self Care | Payer: Self-pay | Source: Ambulatory Visit | Attending: Orthopedic Surgery | Admitting: Orthopedic Surgery

## 2023-09-26 ENCOUNTER — Other Ambulatory Visit: Payer: Self-pay | Admitting: Family Medicine

## 2023-09-26 DIAGNOSIS — Z049 Encounter for examination and observation for unspecified reason: Secondary | ICD-10-CM

## 2023-09-27 NOTE — Progress Notes (Deleted)
Referring Physician:  No referring provider defined for this encounter.  Primary Physician:  Danella Penton, MD  History of Present Illness: 09/27/2023*** [name] has a history of HTN, CAD, STEMI, and hyperlipidemia. History of allergic reaction to alpha-gal.   Neck and arm pain  PCP did trigger point injectin on 09/02/23 to help with neck pain.   Duration: *** Location: *** Quality: *** Severity: ***  Precipitating: aggravated by *** Modifying factors: made better by *** Weakness: none Timing: *** Bowel/Bladder Dysfunction: none  Conservative measures:  Physical therapy: *** has not participated in? Multimodal medical therapy including regular antiinflammatories: motrin Injections: *** no epidural steroid injections?  Past Surgery: *** no spinal surgeries  George Jimenez has ***no symptoms of cervical myelopathy.  The symptoms are causing a significant impact on the patient's life.   Review of Systems:  A 10 point review of systems is negative, except for the pertinent positives and negatives detailed in the HPI.  Past Medical History: Past Medical History:  Diagnosis Date   Diverticulitis    Dyslipidemia    low HDL   HTN (hypertension)    STEMI (ST elevation myocardial infarction) (HCC) 08/17/2015   DES LAD    Past Surgical History: Past Surgical History:  Procedure Laterality Date   APPENDECTOMY     Bilateral inguinal hernia surgery   03/23/2015   CARDIAC CATHETERIZATION N/A 08/17/2015   Procedure: Left Heart Cath and Coronary Angiography;  Surgeon: Lennette Bihari, MD; LAD 99 & 40%, RCA 40%, EF 45-50%    CARDIAC CATHETERIZATION Right 08/17/2015   Procedure: Coronary Stent Intervention;  Surgeon: Lennette Bihari, MD; mLAD 99%>0 w/ 3.5 x 22 mm Resolute DES; Laterality: Right     Allergies: Allergies as of 09/30/2023 - Review Complete 09/25/2022  Allergen Reaction Noted   Levofloxacin Anaphylaxis, Shortness Of Breath, and Swelling 02/22/2014    Alpha-gal Swelling 09/06/2020   Levaquin [levofloxacin in d5w]  08/17/2015   Ace inhibitors Cough 08/17/2015    Medications: Outpatient Encounter Medications as of 09/30/2023  Medication Sig   amLODipine (NORVASC) 5 MG tablet TAKE 1 TABLET BY MOUTH DAILY   aspirin EC 81 MG tablet Take 81 mg by mouth daily.   atorvastatin (LIPITOR) 80 MG tablet TAKE 1 TABLET BY MOUTH DAILY AT 6PM   carvedilol (COREG) 6.25 MG tablet TAKE 1 TABLET BY MOUTH TWICE DAILY   EPINEPHrine 0.3 mg/0.3 mL IJ SOAJ injection    KRILL OIL PO Take by mouth daily.   losartan (COZAAR) 100 MG tablet Take 100 mg by mouth daily.   Multiple Vitamin (MULTIVITAMIN WITH MINERALS) TABS tablet Take 1 tablet by mouth daily. Reported on 09/06/2015   nitroGLYCERIN (NITROSTAT) 0.4 MG SL tablet DISSOLVE ONE TABLET UNDER THE TONGUE AS NEEDED FOR CHEST PAIN. MAY REPEAT AS INDICATED BY YOUR DOCTOR.   selenium sulfide (SELSUN) 2.5 % shampoo Apply 1 application topically daily.   No facility-administered encounter medications on file as of 09/30/2023.    Social History: Social History   Tobacco Use   Smoking status: Never   Smokeless tobacco: Never  Substance Use Topics   Alcohol use: No    Family Medical History: Family History  Problem Relation Age of Onset   Heart attack Father        CABG in 72s    Physical Examination: There were no vitals filed for this visit.  General: Patient is well developed, well nourished, calm, collected, and in no apparent distress. Attention to examination is appropriate.  Respiratory: Patient is breathing without any difficulty.   NEUROLOGICAL:     Awake, alert, oriented to person, place, and time.  Speech is clear and fluent. Fund of knowledge is appropriate.   Cranial Nerves: Pupils equal round and reactive to light.  Facial tone is symmetric.    *** ROM of cervical spine *** pain *** posterior cervical tenderness. *** tenderness in bilateral trapezial region.   *** ROM of lumbar  spine *** pain *** posterior lumbar tenderness.   No abnormal lesions on exposed skin.   Strength: Side Biceps Triceps Deltoid Interossei Grip Wrist Ext. Wrist Flex.  R 5 5 5 5 5 5 5   L 5 5 5 5 5 5 5    Side Iliopsoas Quads Hamstring PF DF EHL  R 5 5 5 5 5 5   L 5 5 5 5 5 5    Reflexes are ***2+ and symmetric at the biceps, brachioradialis, patella and achilles.   Hoffman's is absent.  Clonus is not present.   Bilateral upper and lower extremity sensation is intact to light touch.     Gait is normal.   ***No difficulty with tandem gait.    Medical Decision Making  Imaging: Cervical MRI dated 09/13/23:  FINDINGS: Alignment: Straightening of the normal cervical lordosis. Trace anterolisthesis of C3 on C4 and C4 on C5, chronic and facet mediated.   Vertebrae: Vertebral body height maintained without acute or chronic fracture. Bone marrow signal intensity within normal limits. No worrisome osseous lesions. Mild degenerative reactive endplate changes present about the C4-5 interspace. Reactive marrow edema present about the right C2-3 and left C4-5 facets due to facet arthritis.   Cord: Normal signal and morphology. Apparent small focus of signal abnormality involving the dorsal cord at the level of C5-6 on axial T2 weighted sequence noted, not seen on corresponding sequences, and favored to be artifactual (series 8, image 27).   Posterior Fossa, vertebral arteries, paraspinal tissues: Unremarkable.   Disc levels:   C2-C3: Mild disc bulge with uncovertebral spurring. Advanced right-sided facet arthrosis with reactive marrow edema. No spinal stenosis. Foramina remain patent.   C3-C4: Mild disc bulge with uncovertebral spurring. Moderate left greater than right facet arthrosis. No spinal stenosis. Mild right with mild to moderate left C4 foraminal stenosis.   C4-C5: Left eccentric disc bulge with uncovertebral spurring. Moderate left with mild right facet arthrosis. No  spinal stenosis. Moderate left C5 foraminal narrowing. Right neural foramina remains patent.   C5-C6: Degenerate intervertebral disc space narrowing with diffuse disc osteophyte complex. Broad posterior component flattens and effaces the ventral thecal sac, asymmetric to the right. Mild cord flattening without cord signal changes. Moderate spinal stenosis. Severe right with moderate left C6 foraminal narrowing.   C6-C7: Mild degenerative vertebral disc space narrowing with circumferential disc osteophyte complex. No spinal stenosis. Mild right with moderate left C7 foraminal narrowing.   C7-T1: Negative interspace. Mild right-sided facet hypertrophy. No canal or foraminal stenosis.   IMPRESSION: 1. Multilevel cervical spondylosis with resultant moderate spinal stenosis at C5-6. 2. Multifactorial degenerative changes with resultant multilevel foraminal narrowing as above. Notable findings include mild to moderate left C4 foraminal stenosis, moderate left C5 foraminal narrowing, severe right with moderate left C6 foraminal stenosis, with moderate left C7 foraminal narrowing. 3. Reactive marrow edema about the right C2-3 and left C4-5 facets due to facet arthritis. Finding could serve as a source for neck pain and referred symptoms.     Electronically Signed   By: Janell Quiet.D.  On: 09/16/2023 02:32  I have personally reviewed the images and agree with the above interpretation.  Cervical xrays dated 09/02/23:  Slight slip C3-C4 and C4-C5, moderate DDD C5-C7.   Report not available for above xrays.   Assessment and Plan: Mr. Shavers is a pleasant 68 y.o. male has ***  Treatment options discussed with patient and following plan made:   - Order for physical therapy for *** spine ***. Patient to call to schedule appointment. *** - Continue current medications including ***. Reviewed dosing and side effects.  - Prescription for ***. Reviewed dosing and side effects.  Take with food.  - Prescription for *** to take prn muscle spasms. Reviewed dosing and side effects. Discussed this can cause drowsiness.  - MRI of *** to further evaluate *** radiculopathy. No improvement time or medications (***).  - Referral to PMR at [facility] to discuss possible *** injections.  - Will schedule phone visit to review MRI results once I get them back.   I spent a total of *** minutes in face-to-face and non-face-to-face activities related to this patient's care today including review of outside records, review of imaging, review of symptoms, physical exam, discussion of differential diagnosis, discussion of treatment options, and documentation.   Thank you for involving me in the care of this patient.   Drake Leach PA-C Dept. of Neurosurgery

## 2023-09-30 ENCOUNTER — Other Ambulatory Visit (HOSPITAL_COMMUNITY): Payer: Self-pay

## 2023-09-30 ENCOUNTER — Ambulatory Visit: Payer: No Typology Code available for payment source | Admitting: Orthopedic Surgery

## 2023-10-06 NOTE — Progress Notes (Deleted)
 Referring Physician:  No referring provider defined for this encounter.  Primary Physician:  George Oneil FALCON, MD  History of Present Illness: 10/06/2023*** Mr. [name] has a history of HTN, CAD, STEMI, and hyperlipidemia. History of allergic reaction to alpha-gal.   Neck and arm pain  PCP did trigger point injectin on 09/02/23 to help with neck pain.   Duration: *** Location: *** Quality: *** Severity: ***  Precipitating: aggravated by *** Modifying factors: made better by *** Weakness: none Timing: *** Bowel/Bladder Dysfunction: none  Conservative measures:  Physical therapy: *** has not participated in? Multimodal medical therapy including regular antiinflammatories: motrin Injections: *** no epidural steroid injections?  Past Surgery: *** no spinal surgeries  George Jimenez has ***no symptoms of cervical myelopathy.  The symptoms are causing a significant impact on the patient's life.   Review of Systems:  A 10 point review of systems is negative, except for the pertinent positives and negatives detailed in the HPI.  Past Medical History: Past Medical History:  Diagnosis Date   Diverticulitis    Dyslipidemia    low HDL   HTN (hypertension)    STEMI (ST elevation myocardial infarction) (HCC) 08/17/2015   DES LAD    Past Surgical History: Past Surgical History:  Procedure Laterality Date   APPENDECTOMY     Bilateral inguinal hernia surgery   03/23/2015   CARDIAC CATHETERIZATION N/A 08/17/2015   Procedure: Left Heart Cath and Coronary Angiography;  Surgeon: George DELENA Sor, MD; LAD 99 & 40%, RCA 40%, EF 45-50%    CARDIAC CATHETERIZATION Right 08/17/2015   Procedure: Coronary Stent Intervention;  Surgeon: George DELENA Sor, MD; mLAD 99%>0 w/ 3.5 x 22 mm Resolute DES; Laterality: Right     Allergies: Allergies as of 10/10/2023 - Review Complete 09/25/2022  Allergen Reaction Noted   Levofloxacin Anaphylaxis, Shortness Of Breath, and Swelling 02/22/2014    Alpha-gal Swelling 09/06/2020   Levaquin [levofloxacin in d5w]  08/17/2015   Ace inhibitors Cough 08/17/2015    Medications: Outpatient Encounter Medications as of 10/10/2023  Medication Sig   amLODipine  (NORVASC ) 5 MG tablet TAKE 1 TABLET BY MOUTH DAILY   aspirin  EC 81 MG tablet Take 81 mg by mouth daily.   atorvastatin  (LIPITOR ) 80 MG tablet TAKE 1 TABLET BY MOUTH DAILY AT 6PM   carvedilol  (COREG ) 6.25 MG tablet TAKE 1 TABLET BY MOUTH TWICE DAILY   EPINEPHrine 0.3 mg/0.3 mL IJ SOAJ injection    KRILL OIL PO Take by mouth daily.   losartan  (COZAAR ) 100 MG tablet Take 100 mg by mouth daily.   Multiple Vitamin (MULTIVITAMIN WITH MINERALS) TABS tablet Take 1 tablet by mouth daily. Reported on 09/06/2015   nitroGLYCERIN  (NITROSTAT ) 0.4 MG SL tablet DISSOLVE ONE TABLET UNDER THE TONGUE AS NEEDED FOR CHEST PAIN. MAY REPEAT AS INDICATED BY YOUR DOCTOR.   selenium sulfide (SELSUN) 2.5 % shampoo Apply 1 application topically daily.   No facility-administered encounter medications on file as of 10/10/2023.    Social History: Social History   Tobacco Use   Smoking status: Never   Smokeless tobacco: Never  Substance Use Topics   Alcohol use: No    Family Medical History: Family History  Problem Relation Age of Onset   Heart attack Father        CABG in 79s    Physical Examination: There were no vitals filed for this visit.  General: Patient is well developed, well nourished, calm, collected, and in no apparent distress. Attention to examination is appropriate.  Respiratory: Patient is breathing without any difficulty.   NEUROLOGICAL:     Awake, alert, oriented to person, place, and time.  Speech is clear and fluent. Fund of knowledge is appropriate.   Cranial Nerves: Pupils equal round and reactive to light.  Facial tone is symmetric.    *** ROM of cervical spine *** pain *** posterior cervical tenderness. *** tenderness in bilateral trapezial region.   *** ROM of lumbar spine  *** pain *** posterior lumbar tenderness.   No abnormal lesions on exposed skin.   Strength: Side Biceps Triceps Deltoid Interossei Grip Wrist Ext. Wrist Flex.  R 5 5 5 5 5 5 5   L 5 5 5 5 5 5 5    Side Iliopsoas Quads Hamstring PF DF EHL  R 5 5 5 5 5 5   L 5 5 5 5 5 5    Reflexes are ***2+ and symmetric at the biceps, brachioradialis, patella and achilles.   Hoffman's is absent.  Clonus is not present.   Bilateral upper and lower extremity sensation is intact to light touch.     Gait is normal.   ***No difficulty with tandem gait.    Medical Decision Making  Imaging: Cervical MRI dated 09/13/23:  FINDINGS: Alignment: Straightening of the normal cervical lordosis. Trace anterolisthesis of C3 on C4 and C4 on C5, chronic and facet mediated.   Vertebrae: Vertebral body height maintained without acute or chronic fracture. Bone marrow signal intensity within normal limits. No worrisome osseous lesions. Mild degenerative reactive endplate changes present about the C4-5 interspace. Reactive marrow edema present about the right C2-3 and left C4-5 facets due to facet arthritis.   Cord: Normal signal and morphology. Apparent small focus of signal abnormality involving the dorsal cord at the level of C5-6 on axial T2 weighted sequence noted, not seen on corresponding sequences, and favored to be artifactual (series 8, image 27).   Posterior Fossa, vertebral arteries, paraspinal tissues: Unremarkable.   Disc levels:   C2-C3: Mild disc bulge with uncovertebral spurring. Advanced right-sided facet arthrosis with reactive marrow edema. No spinal stenosis. Foramina remain patent.   C3-C4: Mild disc bulge with uncovertebral spurring. Moderate left greater than right facet arthrosis. No spinal stenosis. Mild right with mild to moderate left C4 foraminal stenosis.   C4-C5: Left eccentric disc bulge with uncovertebral spurring. Moderate left with mild right facet arthrosis. No spinal  stenosis. Moderate left C5 foraminal narrowing. Right neural foramina remains patent.   C5-C6: Degenerate intervertebral disc space narrowing with diffuse disc osteophyte complex. Broad posterior component flattens and effaces the ventral thecal sac, asymmetric to the right. Mild cord flattening without cord signal changes. Moderate spinal stenosis. Severe right with moderate left C6 foraminal narrowing.   C6-C7: Mild degenerative vertebral disc space narrowing with circumferential disc osteophyte complex. No spinal stenosis. Mild right with moderate left C7 foraminal narrowing.   C7-T1: Negative interspace. Mild right-sided facet hypertrophy. No canal or foraminal stenosis.   IMPRESSION: 1. Multilevel cervical spondylosis with resultant moderate spinal stenosis at C5-6. 2. Multifactorial degenerative changes with resultant multilevel foraminal narrowing as above. Notable findings include mild to moderate left C4 foraminal stenosis, moderate left C5 foraminal narrowing, severe right with moderate left C6 foraminal stenosis, with moderate left C7 foraminal narrowing. 3. Reactive marrow edema about the right C2-3 and left C4-5 facets due to facet arthritis. Finding could serve as a source for neck pain and referred symptoms.     Electronically Signed   By: Morene Nicholes HERO.D.  On: 09/16/2023 02:32  I have personally reviewed the images and agree with the above interpretation.  Cervical xrays dated 09/02/23:  Slight slip C3-C4 and C4-C5, moderate DDD C5-C7.   Report not available for above xrays.   Assessment and Plan: [name] is a pleasant 68 y.o. male has ***  Treatment options discussed with patient and following plan made:   - Order for physical therapy for *** spine ***. Patient to call to schedule appointment. *** - Continue current medications including ***. Reviewed dosing and side effects.  - Prescription for ***. Reviewed dosing and side effects. Take  with food.  - Prescription for *** to take prn muscle spasms. Reviewed dosing and side effects. Discussed this can cause drowsiness.  - MRI of *** to further evaluate *** radiculopathy. No improvement time or medications (***).  - Referral to PMR at [facility] to discuss possible *** injections.  - Will schedule phone visit to review MRI results once I get them back.   I spent a total of *** minutes in face-to-face and non-face-to-face activities related to this patient's care today including review of outside records, review of imaging, review of symptoms, physical exam, discussion of differential diagnosis, discussion of treatment options, and documentation.   Thank you for involving me in the care of this patient.   Glade Boys PA-C Dept. of Neurosurgery

## 2023-10-08 DIAGNOSIS — J40 Bronchitis, not specified as acute or chronic: Secondary | ICD-10-CM | POA: Diagnosis not present

## 2023-10-08 DIAGNOSIS — J45902 Unspecified asthma with status asthmaticus: Secondary | ICD-10-CM | POA: Diagnosis not present

## 2023-10-08 DIAGNOSIS — I2510 Atherosclerotic heart disease of native coronary artery without angina pectoris: Secondary | ICD-10-CM | POA: Diagnosis not present

## 2023-10-10 ENCOUNTER — Ambulatory Visit: Payer: No Typology Code available for payment source | Admitting: Orthopedic Surgery

## 2023-10-10 ENCOUNTER — Ambulatory Visit (INDEPENDENT_AMBULATORY_CARE_PROVIDER_SITE_OTHER): Payer: Commercial Managed Care - PPO | Admitting: Neurosurgery

## 2023-10-10 ENCOUNTER — Encounter: Payer: Self-pay | Admitting: Neurosurgery

## 2023-10-10 VITALS — BP 134/82 | Ht 72.0 in | Wt 177.0 lb

## 2023-10-10 DIAGNOSIS — M5412 Radiculopathy, cervical region: Secondary | ICD-10-CM | POA: Diagnosis not present

## 2023-10-10 DIAGNOSIS — M4312 Spondylolisthesis, cervical region: Secondary | ICD-10-CM

## 2023-10-10 DIAGNOSIS — M4802 Spinal stenosis, cervical region: Secondary | ICD-10-CM | POA: Diagnosis not present

## 2023-10-10 DIAGNOSIS — G959 Disease of spinal cord, unspecified: Secondary | ICD-10-CM

## 2023-10-10 NOTE — Progress Notes (Signed)
 Referring Physician:  No referring provider defined for this encounter.  Primary Physician:  Cleotilde Oneil FALCON, MD  History of Present Illness: 10/10/2023 [name] is here today with a chief complaint of popping and cracking in his neck for the past year.  He additionally has neck pain that radiates into the left side of his scalp and into his left trapezius muscle.  He gets intermittent numbness in his left arm that can go into his fourth and fifth finger.  This is occurred since December.  More pressing issue is his pain and discomfort around his neck.  He reports minor balance issues, but no dexterity changes.  He has no weakness.  Bowel/Bladder Dysfunction: none  Conservative measures:  Physical therapy:  has not participated in Multimodal medical therapy including regular antiinflammatories: motrin Injections:  no epidural steroid injections?  Past Surgery:  no spinal surgeries  George Jimenez has mild symptoms of cervical myelopathy.  The symptoms are causing a significant impact on the patient's life.   The symptoms are causing a significant impact on the patient's life.   I have utilized the care everywhere function in epic to review the outside records available from external health systems.  Review of Systems:  A 10 point review of systems is negative, except for the pertinent positives and negatives detailed in the HPI.  Past Medical History: Past Medical History:  Diagnosis Date   Diverticulitis    Dyslipidemia    low HDL   HTN (hypertension)    STEMI (ST elevation myocardial infarction) (HCC) 08/17/2015   DES LAD    Past Surgical History: Past Surgical History:  Procedure Laterality Date   APPENDECTOMY     Bilateral inguinal hernia surgery   03/23/2015   CARDIAC CATHETERIZATION N/A 08/17/2015   Procedure: Left Heart Cath and Coronary Angiography;  Surgeon: Debby DELENA Sor, MD; LAD 99 & 40%, RCA 40%, EF 45-50%    CARDIAC CATHETERIZATION Right  08/17/2015   Procedure: Coronary Stent Intervention;  Surgeon: Debby DELENA Sor, MD; mLAD 99%>0 w/ 3.5 x 22 mm Resolute DES; Laterality: Right     Allergies: Allergies as of 10/10/2023 - Review Complete 10/10/2023  Allergen Reaction Noted   Levofloxacin Anaphylaxis, Shortness Of Breath, and Swelling 02/22/2014   Alpha-gal Swelling 09/06/2020   Levaquin [levofloxacin in d5w]  08/17/2015   Ace inhibitors Cough 08/17/2015    Medications:  Current Outpatient Medications:    amLODipine  (NORVASC ) 5 MG tablet, TAKE 1 TABLET BY MOUTH DAILY, Disp: 90 tablet, Rfl: 3   aspirin  EC 81 MG tablet, Take 81 mg by mouth daily., Disp: , Rfl:    carvedilol  (COREG ) 6.25 MG tablet, TAKE 1 TABLET BY MOUTH TWICE DAILY, Disp: 60 tablet, Rfl: 0   losartan  (COZAAR ) 100 MG tablet, Take 100 mg by mouth daily., Disp: , Rfl:    Multiple Vitamin (MULTIVITAMIN WITH MINERALS) TABS tablet, Take 1 tablet by mouth daily. Reported on 09/06/2015, Disp: , Rfl:    nitroGLYCERIN  (NITROSTAT ) 0.4 MG SL tablet, DISSOLVE ONE TABLET UNDER THE TONGUE AS NEEDED FOR CHEST PAIN. MAY REPEAT AS INDICATED BY YOUR DOCTOR., Disp: 25 tablet, Rfl: 3   rosuvastatin (CRESTOR) 20 MG tablet, Take by mouth., Disp: , Rfl:    selenium sulfide (SELSUN) 2.5 % shampoo, Apply 1 application topically daily., Disp: , Rfl: 3  Social History: Social History   Tobacco Use   Smoking status: Never   Smokeless tobacco: Never  Substance Use Topics   Alcohol use: No  Family Medical History: Family History  Problem Relation Age of Onset   Heart attack Father        CABG in 20s    Physical Examination: Vitals:   10/10/23 0846  BP: 134/82    General: Patient is in no apparent distress. Attention to examination is appropriate.  Neck:   Supple.  Full range of motion.  Respiratory: Patient is breathing without any difficulty.   NEUROLOGICAL:     Awake, alert, oriented to person, place, and time.  Speech is clear and fluent.   Cranial Nerves:  Pupils equal round and reactive to light.  Facial tone is symmetric.  Facial sensation is symmetric. Shoulder shrug is symmetric. Tongue protrusion is midline.  There is no pronator drift.  Strength: Side Biceps Triceps Deltoid Interossei Grip Wrist Ext. Wrist Flex.  R 5 5 5 5 5 5 5   L 5 5 5 5 5 5 5    Side Iliopsoas Quads Hamstring PF DF EHL  R 5 5 5 5 5 5   L 5 5 5 5 5 5    Reflexes are 2+ and symmetric at the biceps, triceps, brachioradialis, 3+ patella and 2+ achilles.   Hoffman's is absent.   Bilateral upper and lower extremity sensation is intact to light touch.    No evidence of dysmetria noted.  Gait is normal.  He has mild difficulty with tandem gait   Medical Decision Making  Imaging: MRI C spine 09/13/2023 Disc levels:   C2-C3: Mild disc bulge with uncovertebral spurring. Advanced right-sided facet arthrosis with reactive marrow edema. No spinal stenosis. Foramina remain patent.   C3-C4: Mild disc bulge with uncovertebral spurring. Moderate left greater than right facet arthrosis. No spinal stenosis. Mild right with mild to moderate left C4 foraminal stenosis.   C4-C5: Left eccentric disc bulge with uncovertebral spurring. Moderate left with mild right facet arthrosis. No spinal stenosis. Moderate left C5 foraminal narrowing. Right neural foramina remains patent.   C5-C6: Degenerate intervertebral disc space narrowing with diffuse disc osteophyte complex. Broad posterior component flattens and effaces the ventral thecal sac, asymmetric to the right. Mild cord flattening without cord signal changes. Moderate spinal stenosis. Severe right with moderate left C6 foraminal narrowing.   C6-C7: Mild degenerative vertebral disc space narrowing with circumferential disc osteophyte complex. No spinal stenosis. Mild right with moderate left C7 foraminal narrowing.   C7-T1: Negative interspace. Mild right-sided facet hypertrophy. No canal or foraminal stenosis.    IMPRESSION: 1. Multilevel cervical spondylosis with resultant moderate spinal stenosis at C5-6. 2. Multifactorial degenerative changes with resultant multilevel foraminal narrowing as above. Notable findings include mild to moderate left C4 foraminal stenosis, moderate left C5 foraminal narrowing, severe right with moderate left C6 foraminal stenosis, with moderate left C7 foraminal narrowing. 3. Reactive marrow edema about the right C2-3 and left C4-5 facets due to facet arthritis. Finding could serve as a source for neck+ pain and referred symptoms.     Electronically Signed   By: Morene Hoard M.D.   On: 09/16/2023 02:32  I have personally reviewed the images and agree with the above interpretation.  Assessment and Plan: Mr. Makela is a pleasant 68 y.o. male with C4 on C5 anterolisthesis that is approximately 4 mm on his x-rays, foraminal stenosis on the left at C4-5 and C6-7, and central stenosis at C5-6 causing mild cervical myelopathy as well as left-sided cervical radiculopathy.  As he currently has mild symptoms of myelopathy, I have recommended that he start with physical therapy.  I will see him back in 6 to 8 weeks.  If he is not better at that time, we will review the options which may include C4-7 anterior cervical discectomy and fusion.  I offered him gabapentin, but he would like to defer at this time.  I spent a total of 30 minutes in this patient's care today. This time was spent reviewing pertinent records including imaging studies, obtaining and confirming history, performing a directed evaluation, formulating and discussing my recommendations, and documenting the visit within the medical record.      Thank you for involving me in the care of this patient.      Andjela Wickes K. Clois MD, [facility]

## 2023-10-14 DIAGNOSIS — J189 Pneumonia, unspecified organism: Secondary | ICD-10-CM | POA: Diagnosis not present

## 2023-10-15 DIAGNOSIS — M542 Cervicalgia: Secondary | ICD-10-CM | POA: Diagnosis not present

## 2023-10-15 DIAGNOSIS — M4312 Spondylolisthesis, cervical region: Secondary | ICD-10-CM | POA: Diagnosis not present

## 2023-10-15 DIAGNOSIS — M4722 Other spondylosis with radiculopathy, cervical region: Secondary | ICD-10-CM | POA: Diagnosis not present

## 2023-10-19 ENCOUNTER — Other Ambulatory Visit: Payer: Self-pay | Admitting: Cardiovascular Disease

## 2023-10-21 ENCOUNTER — Telehealth: Payer: Self-pay | Admitting: Cardiovascular Disease

## 2023-10-21 NOTE — Telephone Encounter (Signed)
*  STAT* If patient is at the pharmacy, call can be transferred to refill team.   1. Which medications need to be refilled? (please list name of each medication and dose if known) carvedilol (COREG) 6.25 MG tablet    2. Would you like to learn more about the convenience, safety, & potential cost savings by using the [facility]? No   3. Are you open to using the [facility] (Type [facility]. ) No   4. Which pharmacy/location (including street and city if local pharmacy) is medication to be sent to?  [facility] - [place], [place] - [address]     5. Do they need a 30 day or 90 day supply? 90 day   Pt has scheduled appt on 4/23

## 2023-10-22 ENCOUNTER — Other Ambulatory Visit: Payer: Self-pay

## 2023-10-22 MED ORDER — CARVEDILOL 6.25 MG PO TABS: 6.2500 mg | ORAL_TABLET | Freq: Two times a day (BID) | ORAL | 0 refills | Status: DC

## 2023-10-23 DIAGNOSIS — J45902 Unspecified asthma with status asthmaticus: Secondary | ICD-10-CM | POA: Diagnosis not present

## 2023-10-23 DIAGNOSIS — J40 Bronchitis, not specified as acute or chronic: Secondary | ICD-10-CM | POA: Diagnosis not present

## 2023-10-23 DIAGNOSIS — N3090 Cystitis, unspecified without hematuria: Secondary | ICD-10-CM | POA: Diagnosis not present

## 2023-10-30 DIAGNOSIS — I8002 Phlebitis and thrombophlebitis of superficial vessels of left lower extremity: Secondary | ICD-10-CM | POA: Diagnosis not present

## 2023-10-30 DIAGNOSIS — J40 Bronchitis, not specified as acute or chronic: Secondary | ICD-10-CM | POA: Diagnosis not present

## 2023-10-30 DIAGNOSIS — M5010 Cervical disc disorder with radiculopathy, unspecified cervical region: Secondary | ICD-10-CM | POA: Diagnosis not present

## 2023-11-04 DIAGNOSIS — H903 Sensorineural hearing loss, bilateral: Secondary | ICD-10-CM | POA: Diagnosis not present

## 2023-11-04 DIAGNOSIS — H6123 Impacted cerumen, bilateral: Secondary | ICD-10-CM | POA: Diagnosis not present

## 2023-11-13 DIAGNOSIS — M4802 Spinal stenosis, cervical region: Secondary | ICD-10-CM | POA: Diagnosis not present

## 2023-11-13 DIAGNOSIS — M47812 Spondylosis without myelopathy or radiculopathy, cervical region: Secondary | ICD-10-CM | POA: Diagnosis not present

## 2023-11-13 DIAGNOSIS — M4722 Other spondylosis with radiculopathy, cervical region: Secondary | ICD-10-CM | POA: Diagnosis not present

## 2023-11-15 ENCOUNTER — Ambulatory Visit (INDEPENDENT_AMBULATORY_CARE_PROVIDER_SITE_OTHER)

## 2023-11-15 ENCOUNTER — Other Ambulatory Visit (INDEPENDENT_AMBULATORY_CARE_PROVIDER_SITE_OTHER): Payer: Self-pay | Admitting: Neurosurgery

## 2023-11-15 DIAGNOSIS — M79604 Pain in right leg: Secondary | ICD-10-CM

## 2023-11-15 DIAGNOSIS — M79605 Pain in left leg: Secondary | ICD-10-CM

## 2023-11-19 DIAGNOSIS — I83892 Varicose veins of left lower extremities with other complications: Secondary | ICD-10-CM | POA: Diagnosis not present

## 2023-11-19 DIAGNOSIS — I803 Phlebitis and thrombophlebitis of lower extremities, unspecified: Secondary | ICD-10-CM | POA: Diagnosis not present

## 2023-11-21 ENCOUNTER — Ambulatory Visit: Payer: No Typology Code available for payment source | Admitting: Neurosurgery

## 2023-12-11 ENCOUNTER — Other Ambulatory Visit (INDEPENDENT_AMBULATORY_CARE_PROVIDER_SITE_OTHER): Payer: Self-pay | Admitting: Nurse Practitioner

## 2023-12-11 DIAGNOSIS — I83892 Varicose veins of left lower extremities with other complications: Secondary | ICD-10-CM

## 2023-12-16 ENCOUNTER — Ambulatory Visit (INDEPENDENT_AMBULATORY_CARE_PROVIDER_SITE_OTHER)

## 2023-12-16 DIAGNOSIS — I83892 Varicose veins of left lower extremities with other complications: Secondary | ICD-10-CM | POA: Diagnosis not present

## 2023-12-17 ENCOUNTER — Encounter (INDEPENDENT_AMBULATORY_CARE_PROVIDER_SITE_OTHER): Admitting: Nurse Practitioner

## 2023-12-25 ENCOUNTER — Encounter: Payer: Self-pay | Admitting: Cardiovascular Disease

## 2023-12-25 ENCOUNTER — Ambulatory Visit
Payer: No Typology Code available for payment source | Attending: Cardiovascular Disease | Admitting: Cardiovascular Disease

## 2023-12-25 DIAGNOSIS — I8393 Asymptomatic varicose veins of bilateral lower extremities: Secondary | ICD-10-CM

## 2023-12-25 DIAGNOSIS — Z9861 Coronary angioplasty status: Secondary | ICD-10-CM

## 2023-12-25 DIAGNOSIS — I2510 Atherosclerotic heart disease of native coronary artery without angina pectoris: Secondary | ICD-10-CM | POA: Diagnosis not present

## 2023-12-25 DIAGNOSIS — I10 Essential (primary) hypertension: Secondary | ICD-10-CM | POA: Diagnosis not present

## 2023-12-25 DIAGNOSIS — E785 Hyperlipidemia, unspecified: Secondary | ICD-10-CM

## 2023-12-25 NOTE — Patient Instructions (Signed)
 Medication Instructions:  NO CHANGES *If you need a refill on your cardiac medications before your next appointment, please call your pharmacy*  Lab Work: NO LABS If you have labs (blood work) drawn today and your tests are completely normal, you will receive your results only by: MyChart Message (if you have MyChart) OR A paper copy in the mail If you have any lab test that is abnormal or we need to change your treatment, we will call you to review the results.  Testing/Procedures: NO TESTING  Follow-Up: At [facility], you and your health needs are our priority.  As part of our continuing mission to provide you with exceptional heart care, our providers are all part of one team.  This team includes your primary Cardiologist (physician) and Advanced Practice Providers or APPs (Physician Assistants and Nurse Practitioners) who all work together to provide you with the care you need, when you need it.  Your next appointment:   6 month(s)  Provider:   Sammy Crisp, MD  Other Instructions Have Dr. Annabell Key check Lipoprotein A with other blood work at office visit      1st Floor: - Lobby - Registration  - Pharmacy  - Lab - Cafe  2nd Floor: - PV Lab - Diagnostic Testing (echo, CT, nuclear med)  3rd Floor: - Vacant  4th Floor: - TCTS (cardiothoracic surgery) - AFib Clinic - Structural Heart Clinic - Vascular Surgery  - Vascular Ultrasound  5th Floor: - [facility] (general and EP) - Clinical Pharmacy for coumadin, hypertension, lipid, weight-loss medications, and med management appointments    Valet parking services will be available as well.

## 2023-12-25 NOTE — Progress Notes (Unsigned)
 Patient ID: George Jimenez, male   DOB: 05-15-1956, 68 y.o.   MRN: [id number]       PCP: Dr. Firman Jimenez  HPI: George Jimenez is a 68 y.o. male who presents to the office today for a 12 month follow-up cardiology evaluation.  George Jimenez developed new onset chest discomfort while working out on 08/17/2015 and presented to [facility] emergency room.  His initial ECG was unremarkable, but he then developed profound hyperacute T waves with inferolateral ST depression in ST elevation anterolaterally.  A code STEMI was activated and he was transported to [facility] where he underwent emergent cardiac catheterization by me.  He was found to have mild acute LV dysfunction with an EF 45-50% and mild mid anterolateral hypocontractility.  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  His circumflex vessel was normal and ithere was smooth 20% proximal RCA narrowing of 60-70% mid stenosis between areas of ectasia.  He underwent successful PCI with DES stenting to the proximal LAD with ultimate insertion of a 3.522 mm Resolute DES stent postdilated 3.73 mm.  The 99% stenosis was reduced to 0%.  An ejection fraction done the day following his acute intervention revealed normalization of LV function with an ejection fraction of 60-65%.  There was mild residual hypokinesis of the anteroseptal wall.  There was grade 1 diastolic dysfunction.  Subsequently, he has done well.  He participated in the cardiac rehabilitation program.  He is now back to running and is running up to 4 miles and 32 minutes.  He denies any recurrent chest pain symptoms.  He denies palpitations.  He does have some issues with varicose veins.    Prior to undergoing bilateral hernia surgery I recommended that he undergo a  preoperative nuclear perfusion study in this was done on 02/07/2016.  On his study, the calculated ejection fraction was reduced, but it was not felt to be accurate and visually it appeared to be at least 50%.  He did not develop any ST segment changes during stress.  There was probable diaphragmatic attenuation artifact.  Of note, he had entirely normal perfusion in the LAD territory.  He tolerated his hernia surgery well from a cardiovascular standpoint and this was done on 03/22/2016 by Dr. Juanita Jimenez.   I last saw him in January 2018 at which time he was continuing to exercise and run almost every day up to 2 2-1/2 miles on a treadmill.  At times he has noted some chest burning when he is lying down at night, but denies any exertionally precipitated chest pain or pressure or palpitations with activity.  This discomfort is different from his MI pain.  He tells me that his primary physician had recently checked his laboratory last month and his total cholesterol was 105, LDL 55, HDL 38, and platelet count was 139K.  I saw him in January 2020 at which time he remained stable.  He was no longer running but was using a Peloton bike for at least 30 to 45 minutes 7 days/week every morning.  He sees Dr. Annabell Jimenez at [facility] clinic and laboratory in December 2019 showed a total cholesterol 104, LDL 52, HDL 38, and triglycerides at 80.  He denied any recurrent chest pain, palpitations, presyncope or syncope.  Over the past year he has remained relatively stable.  Unfortunately he developed COVID-19 towards the end of 2020.  He lost his sense of taste alternations.  He only had a fever for 12 hours.  He had body aches for several days.  He was prescribed prednisone and a Z-Pak.  I last saw him in February 2021.  One month month prior to that evaluation his tooth that cracked and he subsequently developed swelling of his lip.  He initially was treated with antibiotics.  H  He had additional lip swelling on several  occasions in different parts of his lip.  He had contacted Dr. Annabell Jimenez who recommended discontinuance of losartan .  He has been off losartan  for 3 days but with just given a prescription which he has not yet filled for olmesartan.  He continued to exercise daily on his bike and denied recurrent anginal symptoms or exertional dyspnea.    He subsequently had seen allergist, Dr. 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  He apparently stopped eating any meat.  Apparently several months later he inadvertently had some meat and had no reaction which raised out in his previous diagnosis.  Recently he has been eating meat intermittently without recurrent problems.  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  He recently had laboratory at the [facility] clinic with Dr. Annabell Jimenez and total cholesterol was 106 with LDL in the 30s.  He also sees Dr. Leonidas Jimenez.    Since I last saw him, he continues to be followed by Dr. Firman Jimenez.  He continues to be very active and does 20 to 30 mm of Peloton 6 to 7 days/week in addition to resistance training approximately 4 days/week.  He had undergone repeat laboratory in December 20 by his primary physician which showed total cholesterol 113, triglycerides 84, HDL 46 and LDL 49.  Hemoglobin A1c was 5.5.  Hemoglobin hematocrit were stable at 13.7/38.8, respectively.  Glucose was 85 and LFTs were normal.  Presently he denies any chest pain or palpitations.  She continues to be on aspirin  81 mg daily, carvedilol  6.25 mg twice a day and losartan  100 mg daily.  He is on atorvastatin  80 mg and is tolerating this well and uses over-the-counter Krill oil.  He presents for evaluation.  Past Medical History:  Diagnosis Date   Diverticulitis    Dyslipidemia    low HDL   HTN (hypertension)    STEMI (ST elevation myocardial infarction) (HCC) 08/17/2015   DES LAD    Past Surgical History:  Procedure Laterality  Date   APPENDECTOMY     Bilateral inguinal hernia surgery   03/23/2015   CARDIAC CATHETERIZATION N/A 08/17/2015   Procedure: Left Heart Cath and Coronary Angiography;  Surgeon: George Ally, MD; LAD 99 & 40%, RCA 40%, EF 45-50%    CARDIAC CATHETERIZATION Right 08/17/2015   Procedure: Coronary Stent Intervention;  Surgeon: George Ally, MD; mLAD 99%>0 w/ 3.5 x 22 mm Resolute DES; Laterality: Right     Allergies  Allergen Reactions   Levofloxacin Anaphylaxis, Shortness Of Breath and Swelling   Alpha-Gal Swelling   Levaquin [Levofloxacin In D5w]    Ace Inhibitors Cough    Current Outpatient Medications  Medication Sig Dispense Refill   amLODipine  (NORVASC ) 5 MG tablet TAKE 1 TABLET BY MOUTH DAILY 90 tablet 3   apixaban (ELIQUIS) 5 MG TABS tablet Take 5 mg by mouth 2 (  two) times daily.     aspirin  EC 81 MG tablet Take 81 mg by mouth daily.     atorvastatin  (LIPITOR ) 80 MG tablet Take 80 mg by mouth daily.     carvedilol  (COREG ) 6.25 MG tablet Take 1 tablet (6.25 mg total) by mouth 2 (two) times daily. 180 tablet 0   losartan  (COZAAR ) 100 MG tablet Take 100 mg by mouth daily.     Multiple Vitamin (MULTIVITAMIN WITH MINERALS) TABS tablet Take 1 tablet by mouth daily. Reported on 09/06/2015     nitroGLYCERIN  (NITROSTAT ) 0.4 MG SL tablet DISSOLVE ONE TABLET UNDER THE TONGUE AS NEEDED FOR CHEST PAIN. MAY REPEAT AS INDICATED BY YOUR DOCTOR. 25 tablet 3   rosuvastatin (CRESTOR) 20 MG tablet Take by mouth.     selenium sulfide (SELSUN) 2.5 % shampoo Apply 1 application topically daily.  3   No current facility-administered medications for this visit.    Social History   Socioeconomic History   Marital status: Married    Spouse name: Not on file   Number of children: Not on file   Years of education: Not on file   Highest education level: Not on file  Occupational History   Not on file  Tobacco Use   Smoking status: Never   Smokeless tobacco: Never  Substance and Sexual Activity    Alcohol use: No   Drug use: Not on file   Sexual activity: Not on file  Other Topics Concern   Not on file  Social History Narrative   Not on file   Social Drivers of Health   Financial Resource Strain: Low Risk  (07/16/2023)   Received from [facility]   Overall Financial Resource Strain (CARDIA)    Difficulty of Paying Living Expenses: Not hard at all  Food Insecurity: No Food Insecurity (07/16/2023)   Received from [facility]   Hunger Vital Sign    Worried About Running Out of Food in the Last Year: Never true    Ran Out of Food in the Last Year: Never true  Transportation Needs: No Transportation Needs (07/16/2023)   Received from [facility] - Transportation    In the past 12 months, has lack of transportation kept you from medical appointments or from getting medications?: No    Lack of Transportation (Non-Medical): No  Physical Activity: Not on file  Stress: Not on file  Social Connections: Not on file  Intimate Partner Violence: Not on file   Additional social history is notable that 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  Family History  Problem Relation Age of Onset   Heart attack Father        CABG in 42s    ROS General: Negative; No fevers, chills, or night sweats HEENT: Negative; No changes in vision or hearing, sinus congestion, difficulty swallowing; recent tooth cracking for which he underwent root canal.  Several recurrent episodes of lip swelling not responsive to antibiotics. Pulmonary: Negative; No cough, wheezing, shortness of breath, hemoptysis Cardiovascular: See HPI: No chest pain, presyncope, syncope, palpatations History of varicose veins GI: Negative; No nausea, vomiting, diarrhea, or abdominal pain GU: Bilateral inguinal hernia Musculoskeletal: Negative; no myalgias, joint pain, or weakness Hematologic: Negative; no easy bruising, bleeding Endocrine: Negative;  no heat/cold intolerance; no diabetes, Neuro: Negative; no changes in balance, headaches Skin: Negative; No rashes or skin lesions Psychiatric: Negative; No behavioral problems, depression Sleep: Negative; No snoring,  daytime sleepiness, hypersomnolence, bruxism, restless legs, hypnogognic hallucinations. Other comprehensive 14 point system review is negative   Physical Exam BP (!) 152/92   Pulse 64   Ht 6' (1.829 m)   Wt 176 lb 12.8 oz (80.2 kg)   SpO2 98%   BMI 23.98 kg/m    Repeat blood pressure by me was 120/74  Wt Readings from Last 3 Encounters:  12/25/23 176 lb 12.8 oz (80.2 kg)  10/10/23 177 lb (80.3 kg)  09/25/22 176 lb (79.8 kg)   General: Alert, oriented, no distress.  Skin: normal turgor, no rashes, warm and dry HEENT: Normocephalic, atraumatic. Pupils equal round and reactive to light; sclera anicteric; extraocular muscles intact;  Nose without nasal septal hypertrophy Mouth/Parynx benign; Mallinpatti scale 2 Neck: No JVD, no carotid bruits; normal carotid upstroke Lungs: clear to ausculatation and percussion; no wheezing or rales Chest wall: without tenderness to palpitation Heart: PMI not displaced, RRR, s1 s2 normal, 1/6 systolic murmur, no diastolic murmur, no rubs, gallops, thrills, or heaves Abdomen: soft, nontender; no hepatosplenomehaly, BS+; abdominal aorta nontender and not dilated by palpation. Back: no CVA tenderness Pulses 2+ Musculoskeletal: full range of motion, normal strength, no joint deformities Extremities: no clubbing cyanosis or edema, Homan's sign negative  Neurologic: grossly nonfocal; Cranial nerves grossly wnl Psychologic: Normal mood and affect  EKG Interpretation Date/Time:  Wednesday December 25 2023 08:39:47 EDT Ventricular Rate:  64 PR Interval:  134 QRS Duration:  92 QT Interval:  378 QTC Calculation: 389 R Axis:   65  Text Interpretation: Normal sinus rhythm Normal ECG When compared with ECG of 18-Aug-2015 06:55, T wave  inversion no longer evident in Anterior leads Confirmed by George Jimenez ([id number]) on 12/25/2023 9:15:24 AM    September 14, 2021 ECG (independently read by me):  NSR at 63; no ectopy    September 06, 2020 ECG (independently read by me): Sinus bradycardia at 57, LVH by voltage  February 2021 ECG (independently read by me): Sinus bradycardia 59 bpm.  No ectopy.  Normal intervals  January 2020 ECG (independently read by me): Normal sinus rhythm at 65 bpm.  No ectopy.  Normal intervals.  January 2018 ECG (independently read by me): Normal sinus rhythm at 75 bpm.  No ectopy.  No significant ST changes.  No ECG evidence for prior MI  April 2017 ECG (independently read by me): Normal sinus rhythm at 61 bpm.  No ECG evidence for his recent myocardial infarction.  Nondiagnostic T-wave in lead aVL.  LABS:     Latest Ref Rng & Units 11/25/2015    9:39 AM 08/18/2015    4:25 AM 08/17/2015    1:12 PM  BMP  Glucose 65 - 99 mg/dL 87  604  540   BUN 7 - 25 mg/dL 21  12  18    Creatinine 0.70 - 1.33 mg/dL 9.81  1.91  4.78   Sodium 135 - 146 mmol/L 139  137  137   Potassium 3.5 - 5.3 mmol/L 4.5  3.7  3.8   Chloride 98 - 110 mmol/L 105  108  103   CO2 20 - 31 mmol/L 26  20  25    Calcium  8.6 - 10.3 mg/dL 8.9  8.3  9.1         Latest Ref Rng & Units 11/25/2015    9:39 AM  Hepatic Function  Total Protein 6.1 - 8.1 g/dL 6.9   Albumin 3.6 - 5.1 g/dL 4.0   AST 10 - 35 U/L 27  ALT 9 - 46 U/L 28   Alk Phosphatase 40 - 115 U/L 67   Total Bilirubin 0.2 - 1.2 mg/dL 0.8        Latest Ref Rng & Units 08/18/2015    4:25 AM 08/17/2015    1:12 PM  CBC  WBC 4.0 - 10.5 K/uL 5.7  8.0   Hemoglobin 13.0 - 17.0 g/dL 16.1  09.6   Hematocrit 39.0 - 52.0 % 34.5  40.2   Platelets 150 - 400 K/uL 100  185    Lab Results  Component Value Date   MCV 86.7 08/18/2015   MCV 86.5 08/17/2015    No results found for: "TSH"  BNP No results found for: "BNP"  ProBNP No results found for: "PROBNP"   Lipid Panel      Component Value Date/Time   CHOL 97 (L) 11/25/2015 0939   TRIG 70 11/25/2015 0939   HDL 36 (L) 11/25/2015 0939   CHOLHDL 2.7 11/25/2015 0939   VLDL 14 11/25/2015 0939   LDLCALC 47 11/25/2015 0939   Laboratory December 2019 from Dr. Annabell Jimenez: Cholesterol 104, LDL 52, HDL 38, triglycerides 80  Most recent laboratory by Dr. Firman Jimenez in 2021 showed total cholesterol 108, LDL 57, HDL 39, triglycerides 59.  Hemoglobin A1c 5.5.  Creatinine 1.1.  RADIOLOGY: VAS US  LOWER EXTREMITY VENOUS REFLUX Result Date: 12/18/2023  Lower Venous Reflux Study Patient Name:  George Jimenez  Date of Exam:   12/16/2023 Medical Rec #: [id number]     Accession #:    [id number] Date of Birth: February 09, 1956      Patient Gender: M Patient Age:   52 years Exam Location:  [facility] Procedure:      VAS US  LOWER EXTREMITY VENOUS REFLUX Referring Phys: George Jimenez --------------------------------------------------------------------------------  Indications: Swelling, and varicosities.  Performing Technologist: George Jimenez RVT  Examination Guidelines: A complete evaluation includes B-mode imaging, spectral Doppler, color Doppler, and power Doppler as needed of all accessible portions of each vessel. Bilateral testing is considered an integral part of a complete examination. Limited examinations for reoccurring indications may be performed as noted. The reflux portion of the exam is performed with the patient in reverse Trendelenburg. Significant venous reflux is defined as >500 ms in the superficial venous system, and >1 second in the deep venous system.  +--------------+---------+------+-----------+------------+--------+ LEFT          Reflux NoRefluxReflux TimeDiameter cmsComments                         Yes                                  +--------------+---------+------+-----------+------------+--------+ CFV                     yes   >1 second                       +--------------+---------+------+-----------+------------+--------+ GSV at SFJ              yes    >500 ms      1.22             +--------------+---------+------+-----------+------------+--------+ GSV prox thigh          yes    >500 ms      .78              +--------------+---------+------+-----------+------------+--------+  GSV mid thigh           yes    >500 ms      .71              +--------------+---------+------+-----------+------------+--------+ GSV dist thigh          yes    >500 ms      .95              +--------------+---------+------+-----------+------------+--------+ GSV at knee             yes    >500 ms      .64              +--------------+---------+------+-----------+------------+--------+ GSV prox calf           yes    >500 ms      .47              +--------------+---------+------+-----------+------------+--------+ SSV prox calf no                            .34              +--------------+---------+------+-----------+------------+--------+   Summary: Left: - No evidence of deep vein thrombosis seen in the left lower extremity, from the common femoral through the popliteal veins. - No evidence of superficial venous thrombosis in the left lower extremity. - Venous reflux is noted in the left common femoral vein. - Venous reflux is noted in the left sapheno-femoral junction. - Venous reflux is noted in the left greater saphenous vein in the thigh. - Venous reflux is noted in the left greater saphenous vein in the calf. - Previous SVT in GSV at knee has resloved.  *See table(s) above for measurements and observations. Electronically signed by George Fogo MD on 12/18/2023 at 10:23:21 AM.    Final     IMPRESSION:  1. Coronary artery disease involving native coronary artery of native heart without angina pectoris   2. Primary hypertension   3. Hyperlipidemia with target LDL less than 70     ASSESSMENT AND PLAN: Mr. Phu Record is a  healthy-appearing 68 year-old active gentleman who suffered an acute coronary syndrome on 08/17/2015.  Emergent catheterization revealed a 99% proximal LAD stenosis for which he underwent successful stenting with a DES stent.  He also was found to have 65% stenosis in his mid RCA for which medical therapy was recommended.  He has been documented have complete normalization of LV function.  He initially was maintained on carvedilol  6.25 mg twice a day in addition to losartan  50 mg.  He developed COVID-19 towards the end of 2020 and had fever for only 12 hours and was treated with prednisone and Z-Pak.  He was unaware of any oxygen desaturation and stayed home quarantined.  His wife also had it as well.  He has had several episodes of lip swelling and due to concerns for possible angioedema he was taken off his ARB therapy.  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  He had not had need for some time but had inadvertently had some meat more recently and did not experience any reaction.  He has since had had several occasions of having me without recurrence of his lip swelling.  Presently, he continues to be very active.  He continues to do Peloton for at least 20 to 30 minutes of high intensity at least  6 to 7 days/week in addition to lifting weights approximately 4 to 5 days/week.  He denies any exertional precipitation of chest pain or change in exercise tolerance.  He is unaware of any exercise-induced palpitations.  Lipids have been aggressively treated and he continues to be on atorvastatin  80 mg daily his most recent lipid panel on August 22, 2021 showing total cholesterol 113, triglycerides 84, HDL 46, LDL 49.  Hemoglobin A1c is excellent at 5.5.  He has normal LFTs.  His blood pressure today is stable on carvedilol  6.25 mg twice a day in addition to losartan  100 mg daily.  He is no longer taking amlodipine .  He is on baby aspirin  81 mg daily.  He will  continue current therapy with plans for follow-up with Dr. Firman Jimenez at the [facility].  I will see him in 1 year for cardiology reevaluation.  George Ally, MD, [facility] 12/25/2023 9:16 AM

## 2023-12-27 ENCOUNTER — Encounter: Payer: Self-pay | Admitting: Cardiovascular Disease

## 2024-01-07 ENCOUNTER — Ambulatory Visit (INDEPENDENT_AMBULATORY_CARE_PROVIDER_SITE_OTHER): Admitting: Nurse Practitioner

## 2024-01-07 VITALS — BP 143/85 | HR 67 | Resp 17 | Ht 72.0 in | Wt 175.8 lb

## 2024-01-07 DIAGNOSIS — I809 Phlebitis and thrombophlebitis of unspecified site: Secondary | ICD-10-CM

## 2024-01-07 DIAGNOSIS — I83813 Varicose veins of bilateral lower extremities with pain: Secondary | ICD-10-CM | POA: Diagnosis not present

## 2024-01-13 ENCOUNTER — Encounter (INDEPENDENT_AMBULATORY_CARE_PROVIDER_SITE_OTHER): Payer: Self-pay | Admitting: Nurse Practitioner

## 2024-01-13 NOTE — Progress Notes (Signed)
 Subjective:    Patient ID: George Jimenez, male    DOB: Apr 29, 1956, 68 y.o.   MRN: [id number] Chief Complaint  Patient presents with   Establish Care    The patient is seen for evaluation of symptomatic varicose veins.  The patient initially had a significant thrombophlebitis in the left lower extremity.  However he was treated with anticoagulation and this has resolved.  Despite that, he continues to have pain and discomfort within the varicosity.  The patient relates burning and stinging which worsened steadily throughout the course of the day, particularly with standing. The patient also notes an aching and throbbing pain over the varicosities, particularly with prolonged dependent positions. The symptoms are significantly improved with elevation.  The patient also notes that during hot weather the symptoms are greatly intensified. The patient states the pain from the varicose veins interferes with work, daily exercise, shopping and household maintenance. At this point, the symptoms are persistent and severe enough that they're having a negative impact on lifestyle and are interfering with daily activities.  There is no history of superficial phlebitis There is no history of ulceration or hemorrhage. The patient denies a significant family history of varicose veins.  The patient has  worn graduated compression in the past. At the present time the patient has  been using over-the-counter analgesics. There is no history of prior surgical intervention or sclerotherapy.  Today noninvasive studies show resolution of the previous superficial phlebitis.  No DVT noted in the left lower extremity.  No evidence of deep venous insufficiency today.  There is significant reflux in the left great saphenous vein throughout it.    Review of Systems  Cardiovascular:  Negative for leg swelling.  All other systems reviewed and are negative.      Objective:    Physical Exam Vitals reviewed.  HENT:      Head: Normocephalic.  Cardiovascular:     Rate and Rhythm: Normal rate.     Pulses: Normal pulses.  Pulmonary:     Effort: Pulmonary effort is normal.  Skin:    General: Skin is warm and dry.  Neurological:     Mental Status: He is alert and oriented to person, place, and time.  Psychiatric:        Mood and Affect: Mood normal.        Behavior: Behavior normal.        Thought Content: Thought content normal.        Judgment: Judgment normal.     BP (!) 143/85 (BP Location: Right Arm, Patient Position: Sitting, Cuff Size: Normal)   Pulse 67   Resp 17   Ht 6' (1.829 m)   Wt 175 lb 12.8 oz (79.7 kg)   BMI 23.84 kg/m   Past Medical History:  Diagnosis Date   Diverticulitis    Dyslipidemia    low HDL   HTN (hypertension)    STEMI (ST elevation myocardial infarction) (HCC) 08/17/2015   DES LAD    Social History   Socioeconomic History   Marital status: Married    Spouse name: Not on file   Number of children: Not on file   Years of education: Not on file   Highest education level: Not on file  Occupational History   Not on file  Tobacco Use   Smoking status: Never   Smokeless tobacco: Never  Substance and Sexual Activity   Alcohol use: No   Drug use: Not on file   Sexual activity: Not on  file  Other Topics Concern   Not on file  Social History Narrative   Not on file   Social Drivers of Health   Financial Resource Strain: Low Risk  (07/16/2023)   Received from [facility]   Overall Financial Resource Strain (CARDIA)    Difficulty of Paying Living Expenses: Not hard at all  Food Insecurity: No Food Insecurity (07/16/2023)   Received from [facility]   Hunger Vital Sign    Worried About Running Out of Food in the Last Year: Never true    Ran Out of Food in the Last Year: Never true  Transportation Needs: No Transportation Needs (07/16/2023)   Received from [facility] - Transportation    In  the past 12 months, has lack of transportation kept you from medical appointments or from getting medications?: No    Lack of Transportation (Non-Medical): No  Physical Activity: Not on file  Stress: Not on file  Social Connections: Not on file  Intimate Partner Violence: Not on file    Past Surgical History:  Procedure Laterality Date   APPENDECTOMY     Bilateral inguinal hernia surgery   03/23/2015   CARDIAC CATHETERIZATION N/A 08/17/2015   Procedure: Left Heart Cath and Coronary Angiography;  Surgeon: Millicent Ally, MD; LAD 99 & 40%, RCA 40%, EF 45-50%    CARDIAC CATHETERIZATION Right 08/17/2015   Procedure: Coronary Stent Intervention;  Surgeon: Millicent Ally, MD; mLAD 99%>0 w/ 3.5 x 22 mm Resolute DES; Laterality: Right     Family History  Problem Relation Age of Onset   Heart attack Father        CABG in 60s    Allergies  Allergen Reactions   Levofloxacin Anaphylaxis, Shortness Of Breath and Swelling   Alpha-Gal Swelling   Levaquin [Levofloxacin In D5w]    Ace Inhibitors Cough       Latest Ref Rng & Units 08/18/2015    4:25 AM 08/17/2015    1:12 PM  CBC  WBC 4.0 - 10.5 K/uL 5.7  8.0   Hemoglobin 13.0 - 17.0 g/dL 16.1  09.6   Hematocrit 39.0 - 52.0 % 34.5  40.2   Platelets 150 - 400 K/uL 100  185        CMP     Component Value Date/Time   NA 139 11/25/2015 0939   K 4.5 11/25/2015 0939   CL 105 11/25/2015 0939   CO2 26 11/25/2015 0939   GLUCOSE 87 11/25/2015 0939   BUN 21 11/25/2015 0939   CREATININE 1.07 11/25/2015 0939   CALCIUM  8.9 11/25/2015 0939   PROT 6.9 11/25/2015 0939   ALBUMIN 4.0 11/25/2015 0939   AST 27 11/25/2015 0939   ALT 28 11/25/2015 0939   ALKPHOS 67 11/25/2015 0939   BILITOT 0.8 11/25/2015 0939   GFRNONAA >60 08/18/2015 0425     No results found.     Assessment & Plan:   1. Varicose veins of both lower extremities with pain (Primary) Recommend  I have reviewed my previous  discussion with the patient regarding   varicose veins and why they cause symptoms. Patient will continue  wearing graduated compression stockings class 1 on a daily basis, beginning first thing in the morning and removing them in the evening.  The patient is CEAP [id number].  The patient has been wearing compression for more than 12 weeks with no or little benefit.  The patient has been exercising daily for  more than 12 weeks. The patient has been elevating and taking OTC pain medications for more than 12 weeks.  None of these have have eliminated the pain related to the varicose veins and venous reflux or the discomfort regarding venous congestion.    In addition, behavioral modification including elevation during the day was again discussed and this will continue.  The patient has utilized over the counter pain medications and has been exercising.  However, at this time conservative therapy has not alleviated the patient's symptoms of leg pain and swelling  Recommend: laser ablation of the  left great saphenous veins to eliminate the symptoms of pain and swelling of the lower extremities caused by the severe superficial venous reflux disease.   2. Thrombophlebitis Previous thrombophlebitis has resolved.  He will continue with conservative therapy as noted above, endovenous laser ablation for treatment of varicosity will also help recurrence of superficial phlebitis.   Current Outpatient Medications on File Prior to Visit  Medication Sig Dispense Refill   aspirin  EC 81 MG tablet Take 81 mg by mouth daily.     atorvastatin  (LIPITOR ) 80 MG tablet Take 80 mg by mouth daily.     carvedilol  (COREG ) 6.25 MG tablet Take 1 tablet (6.25 mg total) by mouth 2 (two) times daily. 180 tablet 0   losartan  (COZAAR ) 100 MG tablet Take 100 mg by mouth daily.     Multiple Vitamin (MULTIVITAMIN WITH MINERALS) TABS tablet Take 1 tablet by mouth daily. Reported on 09/06/2015     nitroGLYCERIN  (NITROSTAT ) 0.4 MG SL tablet DISSOLVE ONE TABLET UNDER THE  TONGUE AS NEEDED FOR CHEST PAIN. MAY REPEAT AS INDICATED BY YOUR DOCTOR. 25 tablet 3   selenium sulfide (SELSUN) 2.5 % shampoo Apply 1 application topically daily.  3   amLODipine  (NORVASC ) 5 MG tablet TAKE 1 TABLET BY MOUTH DAILY (Patient not taking: Reported on 01/07/2024) 90 tablet 3   apixaban (ELIQUIS) 5 MG TABS tablet Take 5 mg by mouth 2 (two) times daily. (Patient not taking: Reported on 01/07/2024)     rosuvastatin (CRESTOR) 20 MG tablet Take by mouth. (Patient not taking: Reported on 01/07/2024)     No current facility-administered medications on file prior to visit.    There are no Patient Instructions on file for this visit. No follow-ups on file.   Witney Huie E Shaterria Sager, NP

## 2024-01-21 ENCOUNTER — Encounter (INDEPENDENT_AMBULATORY_CARE_PROVIDER_SITE_OTHER): Payer: Self-pay

## 2024-02-27 ENCOUNTER — Telehealth (INDEPENDENT_AMBULATORY_CARE_PROVIDER_SITE_OTHER): Payer: Self-pay | Admitting: Vascular Surgery

## 2024-02-27 NOTE — Telephone Encounter (Signed)
 Pt is scheduled for a left leg GSV laser ablation on 8.22.25 with Dr. Marea. Pt will need the standard RX protocol called in to [facility] in [place]. Thank you.

## 2024-02-28 ENCOUNTER — Other Ambulatory Visit (INDEPENDENT_AMBULATORY_CARE_PROVIDER_SITE_OTHER): Payer: Self-pay

## 2024-02-28 NOTE — Telephone Encounter (Signed)
 done

## 2024-03-02 MED ORDER — ALPRAZOLAM 0.5 MG PO TABS: ORAL_TABLET | ORAL | 0 refills | Status: AC

## 2024-04-24 ENCOUNTER — Other Ambulatory Visit (INDEPENDENT_AMBULATORY_CARE_PROVIDER_SITE_OTHER): Admitting: Vascular Surgery

## 2024-05-01 ENCOUNTER — Encounter (INDEPENDENT_AMBULATORY_CARE_PROVIDER_SITE_OTHER)

## 2024-05-22 ENCOUNTER — Ambulatory Visit (INDEPENDENT_AMBULATORY_CARE_PROVIDER_SITE_OTHER): Admitting: Nurse Practitioner

## 2024-05-29 ENCOUNTER — Encounter (INDEPENDENT_AMBULATORY_CARE_PROVIDER_SITE_OTHER): Payer: Self-pay | Admitting: Vascular Surgery

## 2024-05-29 ENCOUNTER — Ambulatory Visit (INDEPENDENT_AMBULATORY_CARE_PROVIDER_SITE_OTHER): Admitting: Vascular Surgery

## 2024-05-29 VITALS — BP 123/80 | HR 71 | Resp 17 | Ht 72.0 in | Wt 174.0 lb

## 2024-05-29 DIAGNOSIS — I83812 Varicose veins of left lower extremities with pain: Secondary | ICD-10-CM | POA: Diagnosis not present

## 2024-05-29 NOTE — Progress Notes (Signed)
 George Jimenez is a 68 y.o. male who presents with symptomatic venous reflux  Past Medical History:  Diagnosis Date   Diverticulitis    Dyslipidemia    low HDL   HTN (hypertension)    STEMI (ST elevation myocardial infarction) (HCC) 08/17/2015   DES LAD    Past Surgical History:  Procedure Laterality Date   APPENDECTOMY     Bilateral inguinal hernia surgery   03/23/2015   CARDIAC CATHETERIZATION N/A 08/17/2015   Procedure: Left Heart Cath and Coronary Angiography;  Surgeon: Debby DELENA Sor, MD; LAD 99 & 40%, RCA 40%, EF 45-50%    CARDIAC CATHETERIZATION Right 08/17/2015   Procedure: Coronary Stent Intervention;  Surgeon: Debby DELENA Sor, MD; mLAD 99%>0 w/ 3.5 x 22 mm Resolute DES; Laterality: Right      Current Outpatient Medications:    ALPRAZolam  (XANAX ) 0.5 MG tablet, Take 1st tablet 1 hour before procedure and take 2nd tablet once arrived in the office, Disp: 2 tablet, Rfl: 0   amLODipine  (NORVASC ) 5 MG tablet, TAKE 1 TABLET BY MOUTH DAILY (Patient not taking: Reported on 01/07/2024), Disp: 90 tablet, Rfl: 3   apixaban (ELIQUIS) 5 MG TABS tablet, Take 5 mg by mouth 2 (two) times daily. (Patient not taking: Reported on 01/07/2024), Disp: , Rfl:    aspirin  EC 81 MG tablet, Take 81 mg by mouth daily., Disp: , Rfl:    atorvastatin  (LIPITOR ) 80 MG tablet, Take 80 mg by mouth daily., Disp: , Rfl:    carvedilol  (COREG ) 6.25 MG tablet, Take 1 tablet (6.25 mg total) by mouth 2 (two) times daily., Disp: 180 tablet, Rfl: 0   losartan  (COZAAR ) 100 MG tablet, Take 100 mg by mouth daily., Disp: , Rfl:    Multiple Vitamin (MULTIVITAMIN WITH MINERALS) TABS tablet, Take 1 tablet by mouth daily. Reported on 09/06/2015, Disp: , Rfl:    nitroGLYCERIN  (NITROSTAT ) 0.4 MG SL tablet, DISSOLVE ONE TABLET UNDER THE TONGUE AS NEEDED FOR CHEST PAIN. MAY REPEAT AS INDICATED BY YOUR DOCTOR., Disp: 25 tablet, Rfl: 3   rosuvastatin (CRESTOR) 20 MG tablet, Take by mouth. (Patient not taking: Reported on 01/07/2024), Disp:  , Rfl:    selenium sulfide (SELSUN) 2.5 % shampoo, Apply 1 application topically daily., Disp: , Rfl: 3  Allergies  Allergen Reactions   Levofloxacin Anaphylaxis, Shortness Of Breath and Swelling   Alpha-Gal Swelling   Levaquin [Levofloxacin In D5w]    Ace Inhibitors Cough     Varicose veins of leg with pain, left   PLAN: The patient's left lower extremity was sterilely prepped and draped. The ultrasound machine was used to visualize the saphenous vein throughout its course. A segment in the distal thigh was selected for access where it joined tortous varicosities after his previous stripping. The saphenous vein was accessed without difficulty using ultrasound guidance with a micropuncture needle. A 0.018 wire was then placed beyond the saphenofemoral junction and the needle was removed. The 65 cm sheath was then placed over the wire and the wire and dilator were removed. The laser fiber was then placed through the sheath and its tip was placed approximately 4-5 centimeters below the saphenofemoral junction. Tumescent anesthesia was then created with a dilute lidocaine  solution. Laser energy was then delivered with constant withdrawal of the sheath and laser fiber. Approximately 1339 joules of energy were delivered over a length of 29 centimeters using a 1470 Hz VenaCure machine at 7 W. Sterile dressings were placed. The patient tolerated the procedure well without obvious complications.  Follow-up in 1 week with post-laser duplex.

## 2024-06-04 ENCOUNTER — Other Ambulatory Visit (INDEPENDENT_AMBULATORY_CARE_PROVIDER_SITE_OTHER): Payer: Self-pay | Admitting: Vascular Surgery

## 2024-06-04 DIAGNOSIS — I83812 Varicose veins of left lower extremities with pain: Secondary | ICD-10-CM

## 2024-06-05 ENCOUNTER — Other Ambulatory Visit (INDEPENDENT_AMBULATORY_CARE_PROVIDER_SITE_OTHER)

## 2024-06-05 DIAGNOSIS — I83812 Varicose veins of left lower extremities with pain: Secondary | ICD-10-CM

## 2024-06-25 ENCOUNTER — Encounter: Payer: Self-pay | Admitting: Student

## 2024-06-25 ENCOUNTER — Ambulatory Visit: Attending: Student | Admitting: Student

## 2024-06-25 VITALS — BP 126/74 | HR 67 | Ht 72.0 in | Wt 175.6 lb

## 2024-06-25 DIAGNOSIS — I2510 Atherosclerotic heart disease of native coronary artery without angina pectoris: Secondary | ICD-10-CM

## 2024-06-25 DIAGNOSIS — I10 Essential (primary) hypertension: Secondary | ICD-10-CM | POA: Diagnosis not present

## 2024-06-25 DIAGNOSIS — E785 Hyperlipidemia, unspecified: Secondary | ICD-10-CM | POA: Diagnosis not present

## 2024-06-25 MED ORDER — LOSARTAN POTASSIUM 100 MG PO TABS: 100.0000 mg | ORAL_TABLET | Freq: Every day | ORAL | 3 refills | Status: AC

## 2024-06-25 MED ORDER — CARVEDILOL 6.25 MG PO TABS: 6.2500 mg | ORAL_TABLET | Freq: Two times a day (BID) | ORAL | 0 refills | Status: AC

## 2024-06-25 MED ORDER — NITROGLYCERIN 0.4 MG SL SUBL: SUBLINGUAL_TABLET | SUBLINGUAL | 3 refills | Status: AC

## 2024-06-25 NOTE — Progress Notes (Signed)
 Cardiology Clinic Note   Date: 06/25/2024 ID: George Jimenez, George Jimenez September 14, 1955, MRN [id number]  Primary Cardiologist:  Lonni Hanson, MD  Chief Complaint   George Jimenez is a 68 y.o. male who presents to the clinic today for routine follow up.   Patient Profile      Past medical history significant for: CAD. LHC 08/17/2015 (STEMI): Ostial to proximal LAD 20%.  Mid RCA 65%.  Proximal LAD #1 95%, #2 40%.  PCI with DES 3.5 x 22 mm to proximal LAD. Echo 08/18/2015: EF 60 to 65%.  Mild hypokinesis of the entire anteroseptal myocardium.  Grade I DD.  Mildly dilated aortic root. Nuclear stress test 02/07/2016: Medium defect of mild severity in the basal inferior septal, basal inferior, mid inferior septal and mid inferior location that is not reversible and consistent with diaphragmatic attenuation.  Low risk study. Hypertension. Hyperlipidemia. Lipid panel 08/26/2023: LDL 44, HDL 31, TG 95, total 94.  In summary, developed new onset chest discomfort while working out on 08/17/2015 and presented to [facility] emergency room. His initial ECG was unremarkable, but he then developed profound hyperacute T waves with inferolateral ST depression in ST elevation anterolaterally. A code STEMI was activated and he was transported to [facility] where he underwent emergent cardiac catheterization. He was found to have mild acute LV dysfunction with an EF 45-50% and mild mid anterolateral hypocontractility. 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 His circumflex vessel was normal and there was smooth 20% proximal RCA narrowing of 60-70% mid stenosis between areas of ectasia. He underwent successful PCI with DES to proximal LAD.  Echo the following day demonstrated EF 60 to 65%.  Nuclear  stress test June 2017 prior to hernia surgery showed a nonreversible defect consistent with diaphragmatic attenuation and was considered a low risk study.  Patient was last seen in the office by Dr. Burnard on 12/25/2023 for routine follow-up.  He was doing well at that time and exercising regularly.  Plan for care to be transitioned to Dr. Hanson secondary to Dr. Joesphine retirement.     History of Present Illness    Today, patient is doing well. Patient denies shortness of breath, dyspnea on exertion, lower extremity edema, orthopnea or PND. No chest pain, pressure, or tightness. No palpitations.  He rides his [employer] stationary bike for 20-30 minutes daily and strength trains at the gym every other day.     ROS: All other systems reviewed and are otherwise negative except as noted in History of Present Illness.  EKGs/Labs Reviewed    EKG Interpretation Date/Time:  Thursday June 25 2024 14:53:44 EDT Ventricular Rate:  62 PR Interval:  150 QRS Duration:  96 QT Interval:  400 QTC Calculation: 406 R Axis:   73  Text Interpretation: Normal sinus rhythm Normal ECG When compared with ECG of 25-Dec-2023 08:39, No significant change was found Confirmed by Loistine Sober [phone]) on 06/25/2024 3:09:16 PM    Physical Exam    VS:  BP 126/74 (BP Location: Left Arm, Patient Position: Sitting, Cuff Size: Normal)   Pulse 67   Ht 6' (1.829 m)   Wt 175 lb 9.6 oz (79.7 kg)   BMI 23.82 kg/m  , BMI Body mass index is 23.82 kg/m.  GEN: Well nourished, well  developed, in no acute distress. Neck: No JVD or carotid bruits. Cardiac:  RRR.  No murmur. No rubs or gallops.   Respiratory:  Respirations regular and unlabored. Clear to auscultation without rales, wheezing or rhonchi. GI: Soft, nontender, nondistended. Extremities: Radials/DP/PT 2+ and equal bilaterally. No clubbing or cyanosis. No edema   Skin: Warm and dry, no rash. Neuro: Strength intact.  Assessment & Plan   CAD S/p PCI with  DES to proximal LAD in the setting of STEMI December 2016.  Patient denies chest pain, pressure or tightness. He is very active using his stationary bike for 20-30 minutes daily and strength training at the gym.  - Continue aspirin , carvedilol ,  Repatha, as needed SL NTG.  Hypertension BP today 126/74. No report of headaches or dizziness.  - Continue carvedilol , losartan .  Hyperlipidemia LDL 44 December 2024, at goal. - Continue Repatha.  - PCP to draw labs in December.   Disposition: Return in 6 months or sooner as needed.          Signed, Barnie HERO. Tavien Chestnut, DNP, NP-C

## 2024-06-25 NOTE — Patient Instructions (Signed)
 Medication Instructions:   Your physician recommends that you continue on your current medications as directed. Please refer to the Current Medication list given to you today.    *If you need a refill on your cardiac medications before your next appointment, please call your pharmacy*  Lab Work:  None ordered at this time   If you have labs (blood work) drawn today and your tests are completely normal, you will receive your results only by:  MyChart Message (if you have MyChart) OR  A paper copy in the mail If you have any lab test that is abnormal or we need to change your treatment, we will call you to review the results.  Testing/Procedures:  None ordered at this time   Referrals:  None ordered at this time   Follow-Up:  At [facility], you and your health needs are our priority.  As part of our continuing mission to provide you with exceptional heart care, our providers are all part of one team.  This team includes your primary Cardiologist (physician) and Advanced Practice Providers or APPs (Physician Assistants and Nurse Practitioners) who all work together to provide you with the care you need, when you need it.  Your next appointment:   5 - 6 month(s)  Provider:    Barnie Hila, NP    We recommend signing up for the patient portal called MyChart.  Sign up information is provided on this After Visit Summary.  MyChart is used to connect with patients for Virtual Visits (Telemedicine).  Patients are able to view lab/test results, encounter notes, upcoming appointments, etc.  Non-urgent messages can be sent to your provider as well.   To learn more about what you can do with MyChart, go to [email or web address].

## 2024-06-26 ENCOUNTER — Encounter (INDEPENDENT_AMBULATORY_CARE_PROVIDER_SITE_OTHER): Payer: Self-pay | Admitting: Nurse Practitioner

## 2024-06-26 ENCOUNTER — Ambulatory Visit (INDEPENDENT_AMBULATORY_CARE_PROVIDER_SITE_OTHER): Admitting: Nurse Practitioner

## 2024-06-26 VITALS — BP 127/78 | HR 56 | Resp 18 | Wt 175.2 lb

## 2024-06-26 DIAGNOSIS — I10 Essential (primary) hypertension: Secondary | ICD-10-CM | POA: Diagnosis not present

## 2024-06-26 DIAGNOSIS — I83813 Varicose veins of bilateral lower extremities with pain: Secondary | ICD-10-CM

## 2024-06-28 ENCOUNTER — Encounter (INDEPENDENT_AMBULATORY_CARE_PROVIDER_SITE_OTHER): Payer: Self-pay | Admitting: Nurse Practitioner

## 2024-06-28 NOTE — Progress Notes (Signed)
 Subjective:    Patient ID: George Jimenez, male    DOB: [birth year], 68 y.o.   MRN: [id number] Chief Complaint  Patient presents with   Follow-up    4 week post laser follow up    The patient returns to the office for followup status post laser ablation of the left saphenous vein on 05/29/2024.  The patient note significant improvement in the lower extremity pain but not resolution of the symptoms. The patient notes multiple residual varicosities bilaterally which continued to hurt with dependent positions and remained tender to palpation. The patient's swelling is minimally from preoperative status. The patient continues to wear graduated compression stockings on a daily basis but these are not eliminating the pain and discomfort. The patient continues to use over-the-counter anti-inflammatory medications to treat the pain and related symptoms but this has not given the patient relief. The patient notes the pain in the lower extremities is causing problems with daily exercise, problems at work and even with household activities such as preparing meals and doing dishes.  The patient is otherwise done well and there have been no complications related to the laser procedure or interval changes in the patient's overall   Post laser ultrasound shows successful ablation of the left great saphenous vein     Review of Systems  All other systems reviewed and are negative.      Objective:   Physical Exam Vitals reviewed.  HENT:     Head: Normocephalic.  Cardiovascular:     Rate and Rhythm: Normal rate.     Pulses: Normal pulses.  Pulmonary:     Effort: Pulmonary effort is normal.  Musculoskeletal:        General: Tenderness present.  Skin:    General: Skin is warm and dry.  Neurological:     Mental Status: He is alert and oriented to person, place, and time.  Psychiatric:        Mood and Affect: Mood normal.        Behavior: Behavior normal.        Thought Content: Thought content  normal.        Judgment: Judgment normal.     BP 127/78   Pulse (!) 56   Resp 18   Wt 175 lb 3.2 oz (79.5 kg)   BMI 23.76 kg/m   Past Medical History:  Diagnosis Date   Diverticulitis    Dyslipidemia    low HDL   HTN (hypertension)    STEMI (ST elevation myocardial infarction) (HCC) 08/17/2015   DES LAD    Social History   Socioeconomic History   Marital status: Married    Spouse name: Not on file   Number of children: Not on file   Years of education: Not on file   Highest education level: Not on file  Occupational History   Not on file  Tobacco Use   Smoking status: Never   Smokeless tobacco: Never  Substance and Sexual Activity   Alcohol use: No   Drug use: Not on file   Sexual activity: Not on file  Other Topics Concern   Not on file  Social History Narrative   Not on file   Social Drivers of Health   Financial Resource Strain: Low Risk  (07/16/2023)   Received from [facility]   Overall Financial Resource Strain (CARDIA)    Difficulty of Paying Living Expenses: Not hard at all  Food Insecurity: No Food Insecurity (07/16/2023)   Received from [facility]   Hunger Vital Sign    Within the past 12 months, you worried that your food would run out before you got the money to buy more.: Never true    Within the past 12 months, the food you bought just didn't last and you didn't have money to get more.: Never true  Transportation Needs: No Transportation Needs (07/16/2023)   Received from [facility] - Transportation    In the past 12 months, has lack of transportation kept you from medical appointments or from getting medications?: No    Lack of Transportation (Non-Medical): No  Physical Activity: Not on file  Stress: Not on file  Social Connections: Not on file  Intimate Partner Violence: Not on file    Past Surgical History:  Procedure Laterality Date   APPENDECTOMY     Bilateral  inguinal hernia surgery   03/23/2015   CARDIAC CATHETERIZATION N/A 08/17/2015   Procedure: Left Heart Cath and Coronary Angiography;  Surgeon: Debby DELENA Sor, MD; LAD 99 & 40%, RCA 40%, EF 45-50%    CARDIAC CATHETERIZATION Right 08/17/2015   Procedure: Coronary Stent Intervention;  Surgeon: Debby DELENA Sor, MD; mLAD 99%>0 w/ 3.5 x 22 mm Resolute DES; Laterality: Right     Family History  Problem Relation Age of Onset   Heart attack Father        CABG in 60s    Allergies  Allergen Reactions   Levofloxacin Anaphylaxis, Shortness Of Breath and Swelling   Alpha-Gal Swelling   Levaquin [Levofloxacin In D5w]    Ace Inhibitors Cough       Latest Ref Rng & Units 08/18/2015    4:25 AM 08/17/2015    1:12 PM  CBC  WBC 4.0 - 10.5 K/uL 5.7  8.0   Hemoglobin 13.0 - 17.0 g/dL 88.1  86.1   Hematocrit 39.0 - 52.0 % 34.5  40.2   Platelets 150 - 400 K/uL 100  185       CMP     Component Value Date/Time   NA 139 11/25/2015 0939   K 4.5 11/25/2015 0939   CL 105 11/25/2015 0939   CO2 26 11/25/2015 0939   GLUCOSE 87 11/25/2015 0939   BUN 21 11/25/2015 0939   CREATININE 1.07 11/25/2015 0939   CALCIUM  8.9 11/25/2015 0939   PROT 6.9 11/25/2015 0939   ALBUMIN 4.0 11/25/2015 0939   AST 27 11/25/2015 0939   ALT 28 11/25/2015 0939   ALKPHOS 67 11/25/2015 0939   BILITOT 0.8 11/25/2015 0939   GFRNONAA >60 08/18/2015 0425     No results found.     Assessment & Plan:   1. Varicose veins of both lower extremities with pain (Primary) Recommend:  The patient has had successful ablation of the previously incompetent saphenous venous system but still has persistent symptoms of pain and swelling that are having a negative impact on daily life and daily activities.  Patient should undergo injection sclerotherapy to treat the residual varicosities.  The risks, benefits and alternative therapies were reviewed in detail with the patient.  All questions were answered.  The patient agrees to  proceed with sclerotherapy at their convenience.  The patient will continue wearing the graduated compression stockings and using the over-the-counter pain medications to treat her symptoms.      2. Primary hypertension Continue antihypertensive medications as already ordered, these medications have been reviewed and there are no changes at this time.   Current Outpatient Medications  on File Prior to Visit  Medication Sig Dispense Refill   aspirin  EC 81 MG tablet Take 81 mg by mouth daily.     carvedilol  (COREG ) 6.25 MG tablet Take 1 tablet (6.25 mg total) by mouth 2 (two) times daily. 180 tablet 0   losartan  (COZAAR ) 100 MG tablet Take 1 tablet (100 mg total) by mouth daily. 90 tablet 3   Multiple Vitamin (MULTIVITAMIN WITH MINERALS) TABS tablet Take 1 tablet by mouth daily. Reported on 09/06/2015     nitroGLYCERIN  (NITROSTAT ) 0.4 MG SL tablet DISSOLVE ONE TABLET UNDER THE TONGUE AS NEEDED FOR CHEST PAIN. MAY REPEAT AS INDICATED BY YOUR DOCTOR. 25 tablet 3   REPATHA SURECLICK 140 MG/ML SOAJ Inject 140 mg into the skin every 14 (fourteen) days.     selenium sulfide (SELSUN) 2.5 % shampoo Apply 1 application topically daily.  3   ALPRAZolam  (XANAX ) 0.5 MG tablet Take 1st tablet 1 hour before procedure and take 2nd tablet once arrived in the office (Patient not taking: Reported on 06/26/2024) 2 tablet 0   amLODipine  (NORVASC ) 5 MG tablet TAKE 1 TABLET BY MOUTH DAILY (Patient not taking: Reported on 06/26/2024) 90 tablet 3   apixaban (ELIQUIS) 5 MG TABS tablet Take 5 mg by mouth 2 (two) times daily. (Patient not taking: Reported on 06/26/2024)     atorvastatin  (LIPITOR ) 80 MG tablet Take 80 mg by mouth daily. (Patient not taking: Reported on 06/26/2024)     rosuvastatin (CRESTOR) 20 MG tablet Take by mouth. (Patient not taking: Reported on 06/26/2024)     No current facility-administered medications on file prior to visit.    There are no Patient Instructions on file for this visit. No  follow-ups on file.   Darielle Hancher E Hollace Michelli, NP

## 2024-08-04 ENCOUNTER — Encounter (INDEPENDENT_AMBULATORY_CARE_PROVIDER_SITE_OTHER): Payer: Self-pay | Admitting: Vascular Surgery

## 2024-08-04 ENCOUNTER — Ambulatory Visit (INDEPENDENT_AMBULATORY_CARE_PROVIDER_SITE_OTHER): Admitting: Vascular Surgery

## 2024-08-04 VITALS — BP 122/78 | HR 63 | Resp 17 | Wt 180.4 lb

## 2024-08-04 DIAGNOSIS — I83812 Varicose veins of left lower extremities with pain: Secondary | ICD-10-CM

## 2024-08-04 NOTE — Progress Notes (Signed)
  George Jimenez is a 68 y.o.male who presents with painful varicose veins of the left leg  Past Medical History:  Diagnosis Date   Diverticulitis    Dyslipidemia    low HDL   HTN (hypertension)    STEMI (ST elevation myocardial infarction) (HCC) 08/17/2015   DES LAD    Past Surgical History:  Procedure Laterality Date   APPENDECTOMY     Bilateral inguinal hernia surgery   03/23/2015   CARDIAC CATHETERIZATION N/A 08/17/2015   Procedure: Left Heart Cath and Coronary Angiography;  Surgeon: Debby DELENA Sor, MD; LAD 99 & 40%, RCA 40%, EF 45-50%    CARDIAC CATHETERIZATION Right 08/17/2015   Procedure: Coronary Stent Intervention;  Surgeon: Debby DELENA Sor, MD; mLAD 99%>0 w/ 3.5 x 22 mm Resolute DES; Laterality: Right     Current Outpatient Medications  Medication Sig Dispense Refill   ALPRAZolam  (XANAX ) 0.5 MG tablet Take 1st tablet 1 hour before procedure and take 2nd tablet once arrived in the office 2 tablet 0   amLODipine  (NORVASC ) 5 MG tablet TAKE 1 TABLET BY MOUTH DAILY 90 tablet 3   apixaban (ELIQUIS) 5 MG TABS tablet Take 5 mg by mouth 2 (two) times daily.     aspirin  EC 81 MG tablet Take 81 mg by mouth daily.     atorvastatin  (LIPITOR ) 80 MG tablet Take 80 mg by mouth daily.     carvedilol  (COREG ) 6.25 MG tablet Take 1 tablet (6.25 mg total) by mouth 2 (two) times daily. 180 tablet 0   losartan  (COZAAR ) 100 MG tablet Take 1 tablet (100 mg total) by mouth daily. 90 tablet 3   Multiple Vitamin (MULTIVITAMIN WITH MINERALS) TABS tablet Take 1 tablet by mouth daily. Reported on 09/06/2015     nitroGLYCERIN  (NITROSTAT ) 0.4 MG SL tablet DISSOLVE ONE TABLET UNDER THE TONGUE AS NEEDED FOR CHEST PAIN. MAY REPEAT AS INDICATED BY YOUR DOCTOR. 25 tablet 3   REPATHA SURECLICK 140 MG/ML SOAJ Inject 140 mg into the skin every 14 (fourteen) days.     rosuvastatin (CRESTOR) 20 MG tablet Take by mouth.     selenium sulfide (SELSUN) 2.5 % shampoo Apply 1 application topically daily.  3   No current  facility-administered medications for this visit.    Allergies  Allergen Reactions   Levofloxacin Anaphylaxis, Shortness Of Breath and Swelling   Alpha-Gal Swelling   Levaquin [Levofloxacin In D5w]    Ace Inhibitors Cough    Indication: Patient presents with symptomatic varicose veins of the left lower extremity.  Procedure: Foam sclerotherapy was performed on the left lower extremity. Using ultrasound guidance, 5 mL of foam Sotradecol was used to inject the varicosities of the left lower extremity. Compression wraps were placed. The patient tolerated the procedure well.

## 2024-09-01 ENCOUNTER — Encounter (INDEPENDENT_AMBULATORY_CARE_PROVIDER_SITE_OTHER): Payer: Self-pay | Admitting: Vascular Surgery

## 2024-09-01 ENCOUNTER — Ambulatory Visit (INDEPENDENT_AMBULATORY_CARE_PROVIDER_SITE_OTHER): Admitting: Vascular Surgery

## 2024-09-01 VITALS — BP 133/89 | HR 66 | Resp 17 | Ht 72.0 in | Wt 177.2 lb

## 2024-09-01 DIAGNOSIS — I83812 Varicose veins of left lower extremities with pain: Secondary | ICD-10-CM

## 2024-09-01 NOTE — Progress Notes (Signed)
" °  George Jimenez is a 68 y.o.male who presents with painful varicose veins of the left leg  Past Medical History:  Diagnosis Date   Diverticulitis    Dyslipidemia    low HDL   HTN (hypertension)    STEMI (ST elevation myocardial infarction) (HCC) 08/17/2015   DES LAD    Past Surgical History:  Procedure Laterality Date   APPENDECTOMY     Bilateral inguinal hernia surgery   03/23/2015   CARDIAC CATHETERIZATION N/A 08/17/2015   Procedure: Left Heart Cath and Coronary Angiography;  Surgeon: Debby DELENA Sor, MD; LAD 99 & 40%, RCA 40%, EF 45-50%    CARDIAC CATHETERIZATION Right 08/17/2015   Procedure: Coronary Stent Intervention;  Surgeon: Debby DELENA Sor, MD; mLAD 99%>0 w/ 3.5 x 22 mm Resolute DES; Laterality: Right     Current Outpatient Medications  Medication Sig Dispense Refill   ALPRAZolam  (XANAX ) 0.5 MG tablet Take 1st tablet 1 hour before procedure and take 2nd tablet once arrived in the office 2 tablet 0   amLODipine  (NORVASC ) 5 MG tablet TAKE 1 TABLET BY MOUTH DAILY 90 tablet 3   apixaban (ELIQUIS) 5 MG TABS tablet Take 5 mg by mouth 2 (two) times daily.     aspirin  EC 81 MG tablet Take 81 mg by mouth daily.     atorvastatin  (LIPITOR ) 80 MG tablet Take 80 mg by mouth daily.     carvedilol  (COREG ) 6.25 MG tablet Take 1 tablet (6.25 mg total) by mouth 2 (two) times daily. 180 tablet 0   losartan  (COZAAR ) 100 MG tablet Take 1 tablet (100 mg total) by mouth daily. 90 tablet 3   Multiple Vitamin (MULTIVITAMIN WITH MINERALS) TABS tablet Take 1 tablet by mouth daily. Reported on 09/06/2015     nitroGLYCERIN  (NITROSTAT ) 0.4 MG SL tablet DISSOLVE ONE TABLET UNDER THE TONGUE AS NEEDED FOR CHEST PAIN. MAY REPEAT AS INDICATED BY YOUR DOCTOR. 25 tablet 3   REPATHA SURECLICK 140 MG/ML SOAJ Inject 140 mg into the skin every 14 (fourteen) days.     rosuvastatin (CRESTOR) 20 MG tablet Take by mouth.     selenium sulfide (SELSUN) 2.5 % shampoo Apply 1 application topically daily.  3   No current  facility-administered medications for this visit.    Allergies[1]  Indication: Patient presents with symptomatic varicose veins of the left lower extremity.  Procedure: Foam sclerotherapy was performed on the left lower extremity. Using ultrasound guidance, 5 mL of foam Sotradecol was used to inject the varicosities of the left lower extremity. Compression wraps were placed. The patient tolerated the procedure well.    [1]  Allergies Allergen Reactions   Levofloxacin Anaphylaxis, Shortness Of Breath and Swelling   Alpha-Gal Swelling   Levaquin [Levofloxacin In D5w]    Ace Inhibitors Cough   "

## 2024-09-29 ENCOUNTER — Ambulatory Visit (INDEPENDENT_AMBULATORY_CARE_PROVIDER_SITE_OTHER): Admitting: Vascular Surgery

## 2024-11-25 ENCOUNTER — Ambulatory Visit: Admitting: Internal Medicine
